# Patient Record
Sex: Male | Born: 1980 | Race: Black or African American | Hispanic: No | Marital: Married | Smoking: Never smoker
Health system: Southern US, Community
[De-identification: ages and names within clinical notes are randomized; demographics above are authoritative.]

## PROBLEM LIST (undated history)

## (undated) DIAGNOSIS — E119 Type 2 diabetes mellitus without complications: Secondary | ICD-10-CM

## (undated) DIAGNOSIS — E1165 Type 2 diabetes mellitus with hyperglycemia: Secondary | ICD-10-CM

## (undated) DIAGNOSIS — Z794 Long term (current) use of insulin: Principal | ICD-10-CM

## (undated) DIAGNOSIS — B181 Chronic viral hepatitis B without delta-agent: Principal | ICD-10-CM

## (undated) HISTORY — DX: Type 2 diabetes mellitus without complications: E11.9

---

## 2016-06-17 DIAGNOSIS — L308 Other specified dermatitis: Secondary | ICD-10-CM | POA: Diagnosis not present

## 2016-06-17 DIAGNOSIS — B360 Pityriasis versicolor: Secondary | ICD-10-CM | POA: Diagnosis not present

## 2016-06-17 DIAGNOSIS — D485 Neoplasm of uncertain behavior of skin: Secondary | ICD-10-CM | POA: Diagnosis not present

## 2016-07-29 DIAGNOSIS — E785 Hyperlipidemia, unspecified: Secondary | ICD-10-CM | POA: Diagnosis not present

## 2016-07-29 DIAGNOSIS — E119 Type 2 diabetes mellitus without complications: Secondary | ICD-10-CM | POA: Diagnosis not present

## 2016-07-29 DIAGNOSIS — I10 Essential (primary) hypertension: Secondary | ICD-10-CM | POA: Diagnosis not present

## 2016-08-01 DIAGNOSIS — H52223 Regular astigmatism, bilateral: Secondary | ICD-10-CM | POA: Diagnosis not present

## 2016-10-07 DIAGNOSIS — Z23 Encounter for immunization: Secondary | ICD-10-CM | POA: Diagnosis not present

## 2016-11-10 DIAGNOSIS — E785 Hyperlipidemia, unspecified: Secondary | ICD-10-CM | POA: Diagnosis not present

## 2016-11-10 DIAGNOSIS — E119 Type 2 diabetes mellitus without complications: Secondary | ICD-10-CM | POA: Diagnosis not present

## 2017-01-22 DIAGNOSIS — G510 Bell's palsy: Secondary | ICD-10-CM | POA: Diagnosis not present

## 2017-01-22 DIAGNOSIS — E119 Type 2 diabetes mellitus without complications: Secondary | ICD-10-CM | POA: Diagnosis not present

## 2017-02-25 DIAGNOSIS — E119 Type 2 diabetes mellitus without complications: Secondary | ICD-10-CM | POA: Diagnosis not present

## 2017-02-25 DIAGNOSIS — E785 Hyperlipidemia, unspecified: Secondary | ICD-10-CM | POA: Diagnosis not present

## 2017-09-29 DIAGNOSIS — E119 Type 2 diabetes mellitus without complications: Secondary | ICD-10-CM | POA: Diagnosis not present

## 2017-10-26 DIAGNOSIS — E119 Type 2 diabetes mellitus without complications: Secondary | ICD-10-CM | POA: Diagnosis not present

## 2017-10-26 DIAGNOSIS — J069 Acute upper respiratory infection, unspecified: Secondary | ICD-10-CM | POA: Diagnosis not present

## 2017-12-22 DIAGNOSIS — E785 Hyperlipidemia, unspecified: Secondary | ICD-10-CM | POA: Diagnosis not present

## 2017-12-22 DIAGNOSIS — E1165 Type 2 diabetes mellitus with hyperglycemia: Secondary | ICD-10-CM | POA: Diagnosis not present

## 2017-12-22 DIAGNOSIS — R030 Elevated blood-pressure reading, without diagnosis of hypertension: Secondary | ICD-10-CM | POA: Diagnosis not present

## 2017-12-22 DIAGNOSIS — B360 Pityriasis versicolor: Secondary | ICD-10-CM | POA: Diagnosis not present

## 2018-02-16 DIAGNOSIS — E1165 Type 2 diabetes mellitus with hyperglycemia: Secondary | ICD-10-CM | POA: Diagnosis not present

## 2018-03-12 DIAGNOSIS — R030 Elevated blood-pressure reading, without diagnosis of hypertension: Secondary | ICD-10-CM | POA: Diagnosis not present

## 2018-03-12 DIAGNOSIS — N342 Other urethritis: Secondary | ICD-10-CM | POA: Diagnosis not present

## 2018-04-14 ENCOUNTER — Encounter: Payer: Self-pay | Admitting: Endocrinology

## 2018-04-14 ENCOUNTER — Ambulatory Visit: Payer: BLUE CROSS/BLUE SHIELD | Admitting: Endocrinology

## 2018-04-14 DIAGNOSIS — E119 Type 2 diabetes mellitus without complications: Secondary | ICD-10-CM | POA: Diagnosis not present

## 2018-04-14 LAB — POCT GLYCOSYLATED HEMOGLOBIN (HGB A1C): Hemoglobin A1C: 11.5 % — AB (ref 4.0–5.6)

## 2018-04-14 MED ORDER — LINAGLIPTIN-METFORMIN HCL ER 2.5-1000 MG PO TB24: 2.0000 | ORAL_TABLET | ORAL | 11 refills | Status: DC

## 2018-04-14 NOTE — Patient Instructions (Signed)
good diet and exercise significantly improve the control of your diabetes.  please let me know if you wish to be referred to a dietician.  high blood sugar is very risky to your health.  you should see an eye doctor and dentist every year.  It is very important to get all recommended vaccinations.  Controlling your blood pressure and cholesterol drastically reduces the damage diabetes does to your body.  Those who smoke should quit.  Please discuss these with your doctor.  check your blood sugar once a day.  vary the time of day when you check, between before the 3 meals, and at bedtime.  also check if you have symptoms of your blood sugar being too high or too low.  please keep a record of the readings and bring it to your next appointment here (or you can bring the meter itself).  You can write it on any piece of paper.  please call us sooner if your blood sugar goes below 70, or if you have a lot of readings over 200.   Please continue the same jardiance. I have sent a prescription to your pharmacy for "jentadueto." Please call or message us next week, to tell us how the blood sugar is doing.  We can add other medications, if necessary.  Out goal is to get the blood sugar to the low-100's.  Please come back for a follow-up appointment in 2 months.

## 2018-04-14 NOTE — Progress Notes (Signed)
Subjective:    Patient ID: Duane Gonzales, male    DOB: 22-Jan-1981, 37 y.o.   MRN: [id number]  HPI pt is referred by Dr Reola Calkins, for diabetes.  Pt states DM was dx'ed in 2018; he has mild if any neuropathy of the lower extremities; he is unaware of any associated chronic complications; he has never been on insulin; pt says his diet and exercise are fair; he has never had pancreatitis, pancreatic surgery, severe hypoglycemia or DKA.  He says metformin causes diarrhea.  He says cbg's are in the 200's.   Past Medical History:  Diagnosis Date  . Diabetes [facility])      Social History   Socioeconomic History  . Marital status: Married    Spouse name: Not on file  . Number of children: Not on file  . Years of education: Not on file  . Highest education level: Not on file  Occupational History  . Not on file  Social Needs  . Financial resource strain: Not on file  . Food insecurity:    Worry: Not on file    Inability: Not on file  . Transportation needs:    Medical: Not on file    Non-medical: Not on file  Tobacco Use  . Smoking status: Never Smoker  . Smokeless tobacco: Never Used  Substance and Sexual Activity  . Alcohol use: Never    Frequency: Never  . Drug use: Never  . Sexual activity: Yes    Partners: Female  Lifestyle  . Physical activity:    Days per week: Not on file    Minutes per session: Not on file  . Stress: Not on file  Relationships  . Social connections:    Talks on phone: Not on file    Gets together: Not on file    Attends religious service: Not on file    Active member of club or organization: Not on file    Attends meetings of clubs or organizations: Not on file    Relationship status: Not on file  . Intimate partner violence:    Fear of current or ex partner: Not on file    Emotionally abused: Not on file    Physically abused: Not on file    Forced sexual activity: Not on file  Other Topics Concern  . Not on file  Social History Narrative  .  Not on file    Current Outpatient Medications on File Prior to Visit  Medication Sig Dispense Refill  . atorvastatin (LIPITOR) 20 MG tablet     . empagliflozin (JARDIANCE) 10 MG TABS tablet     . metFORMIN (GLUCOPHAGE) 850 MG tablet      No current facility-administered medications on file prior to visit.     Allergies  Allergen Reactions  . Metformin And Related Diarrhea    Family History  Problem Relation Age of Onset  . Diabetes Father     BP 124/86 (BP Location: Left Arm, Patient Position: Sitting, Cuff Size: Normal)   Pulse 90   Ht 5' 9.5" (1.765 m)   Wt 196 lb 6.4 oz (89.1 kg)   SpO2 97%   BMI 28.59 kg/m    Review of Systems denies blurry vision, headache, chest pain, sob, n/v, urinary frequency, muscle cramps, memory loss, depression, cold intolerance, rhinorrhea, and easy bruising. He has lost 30 lbs x 6 mos.  He has chronically excessive diaphoresis.     Objective:   Physical Exam VS: see vs page GEN: no distress  HEAD: head: no deformity eyes: no periorbital swelling, no proptosis external nose and ears are normal mouth: no lesion seen NECK: supple, thyroid is not enlarged CHEST WALL: no deformity LUNGS: clear to auscultation CV: reg rate and rhythm, no murmur ABD: abdomen is soft, nontender.  no hepatosplenomegaly.  not distended.  no hernia MUSCULOSKELETAL: muscle bulk and strength are grossly normal.  no obvious joint swelling.  gait is normal and steady EXTEMITIES: no deformity.  no ulcer on the feet.  feet are of normal color and temp.  no edema PULSES: dorsalis pedis intact bilat.  no carotid bruit NEURO:  cn 2-12 grossly intact.   readily moves all 4's.  sensation is intact to touch on the feet SKIN:  Normal texture and temperature.  No rash or suspicious lesion is visible.   NODES:  None palpable at the neck PSYCH: alert, well-oriented.  Does not appear anxious nor depressed.    Lab Results  Component Value Date   HGBA1C 11.5 (A) 04/14/2018     I have reviewed outside records, and summarized: Pt was noted to have elevated a1c, and referred here.  He was about to leave for [place].  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  I advised insulin, but he declines.  Diarrhea: she needs to change metformin to -XR  Patient Instructions  good diet and exercise significantly improve the control of your diabetes.  please let me know if you wish to be referred to a dietician.  high blood sugar is very risky to your health.  you should see an eye doctor and dentist every year.  It is very important to get all recommended vaccinations.  Controlling your blood pressure and cholesterol drastically reduces the damage diabetes does to your body.  Those who smoke should quit.  Please discuss these with your doctor.  check your blood sugar once a day.  vary the time of day when you check, between before the 3 meals, and at bedtime.  also check if you have symptoms of your blood sugar being too high or too low.  please keep a record of the readings and bring it to your next appointment here (or you can bring the meter itself).  You can write it on any piece of paper.  please call us sooner if your blood sugar goes below 70, or if you have a lot of readings over 200.   Please continue the same jardiance. I have sent a prescription to your pharmacy for "jentadueto." Please call or message us next week, to tell us how the blood sugar is doing.  We can add other medications, if necessary.  Out goal is to get the blood sugar to the low-100's.  Please come back for a follow-up appointment in 2 months.

## 2018-04-16 DIAGNOSIS — H52223 Regular astigmatism, bilateral: Secondary | ICD-10-CM | POA: Diagnosis not present

## 2018-04-17 ENCOUNTER — Encounter: Payer: Self-pay | Admitting: Endocrinology

## 2018-04-29 ENCOUNTER — Telehealth: Payer: Self-pay | Admitting: Endocrinology

## 2018-04-29 DIAGNOSIS — S46812A Strain of other muscles, fascia and tendons at shoulder and upper arm level, left arm, initial encounter: Secondary | ICD-10-CM | POA: Diagnosis not present

## 2018-04-29 DIAGNOSIS — G5602 Carpal tunnel syndrome, left upper limb: Secondary | ICD-10-CM | POA: Diagnosis not present

## 2018-04-29 NOTE — Telephone Encounter (Signed)
[facility] [place] - [address]   Patient stated that the medication needs to be approved per the pharmacy         linaGLIPtin-metFORMIN HCl ER (JENTADUETO XR) 2.12-998 MG TB24

## 2018-04-30 ENCOUNTER — Telehealth: Payer: Self-pay | Admitting: Endocrinology

## 2018-04-30 NOTE — Telephone Encounter (Signed)
[facility] is calling to let us know they have faxed over a 2nd request for a PA ,     linaGLIPtin-metFORMIN HCl ER (JENTADUETO XR) 2.12-998 MG TB24

## 2018-05-03 NOTE — Telephone Encounter (Signed)
PA has been iniated awaiting a response

## 2018-05-03 NOTE — Telephone Encounter (Signed)
PA has been started awaiting response

## 2018-05-10 NOTE — Telephone Encounter (Signed)
Pt called stating that the PA was denied pt staes the insurance company called him. Pt was told another 2nd line Rx needs to be filled and if that fails pt can try another line of medication. Please advise thank you!  Pharmacy is [facility] - [place], [place] - [address]  Call pt @ [phone].

## 2018-05-11 MED ORDER — PIOGLITAZONE HCL 30 MG PO TABS: 30.0000 mg | ORAL_TABLET | Freq: Every day | ORAL | 3 refills | Status: DC

## 2018-05-11 NOTE — Addendum Note (Signed)
Addended by: Romero BellingELLISON, Williamson Cavanah on: 05/11/2018 04:35 PM   Modules accepted: Orders

## 2018-05-11 NOTE — Telephone Encounter (Signed)
Please advise on what else pt can be placed on

## 2018-05-11 NOTE — Telephone Encounter (Signed)
Ok, I have sent a prescription to your pharmacy, to add "pioglitazone." I'll see you next time.

## 2018-05-12 ENCOUNTER — Telehealth: Payer: Self-pay | Admitting: Endocrinology

## 2018-05-12 NOTE — Telephone Encounter (Signed)
I need to know which med is being declined?

## 2018-05-12 NOTE — Telephone Encounter (Signed)
Informed pt that this was sent to his pharmacy

## 2018-05-12 NOTE — Telephone Encounter (Signed)
PER THMCC-"Caller states a medication that was written for him needs a doctor to approve the second line treatment for the insurance. Will need the rx changed. Is on Metformin but having side effect"

## 2018-05-12 NOTE — Telephone Encounter (Signed)
Please advise 

## 2018-05-13 MED ORDER — LINAGLIPTIN 5 MG PO TABS: 5.0000 mg | ORAL_TABLET | Freq: Every day | ORAL | 11 refills | Status: DC

## 2018-05-13 NOTE — Telephone Encounter (Signed)
Pt would like for Dr. Everardo All to call him personally because he stated that what he is taking is in his chart and I should not be calling making sure of what he is taking because we have a list of what he is taking. Please advise

## 2018-05-13 NOTE — Telephone Encounter (Signed)
I called pt He takes jardiance and pioglitazone He declines to resume metformin (diarrrhea). cbg's are in the 200's. I have sent a prescription to your pharmacy, to add Tradjenta.  I assume this will need PA, so please complete with this info. Please call or message [place] next week, to tell us how the blood sugar is doing

## 2018-05-13 NOTE — Telephone Encounter (Signed)
lft pt vm to return call 

## 2018-05-13 NOTE — Telephone Encounter (Signed)
Spoke to pt, pt was confused about why he was to start Actos, he wanted to know is he supposed to stop taking the metformin?

## 2018-05-13 NOTE — Telephone Encounter (Signed)
Thi is what I want pt to take: Metformin Pioglitazone jardiance (or another in its category) tradjenta (or another in its category) Please let me know what meds pt takes as of now, and how cbg's are doing

## 2018-06-14 ENCOUNTER — Ambulatory Visit (INDEPENDENT_AMBULATORY_CARE_PROVIDER_SITE_OTHER): Payer: BLUE CROSS/BLUE SHIELD | Admitting: Endocrinology

## 2018-06-14 ENCOUNTER — Encounter: Payer: Self-pay | Admitting: Endocrinology

## 2018-06-14 VITALS — BP 112/70 | HR 79 | Ht 69.5 in | Wt 192.8 lb

## 2018-06-14 DIAGNOSIS — E119 Type 2 diabetes mellitus without complications: Secondary | ICD-10-CM | POA: Diagnosis not present

## 2018-06-14 MED ORDER — GLIMEPIRIDE 2 MG PO TABS: 2.0000 mg | ORAL_TABLET | Freq: Every day | ORAL | 3 refills | Status: DC

## 2018-06-14 MED ORDER — EMPAGLIFLOZIN 10 MG PO TABS: 10.0000 mg | ORAL_TABLET | Freq: Every day | ORAL | 11 refills | Status: DC

## 2018-06-14 NOTE — Progress Notes (Signed)
   Subjective:    Patient ID: Duane Gonzales, male    DOB: 20-Jun-1981, 37 y.o.   MRN: [id number]  HPI    Review of Systems     Objective:   Physical Exam        Assessment & Plan:

## 2018-06-14 NOTE — Progress Notes (Signed)
Subjective:    Patient ID: Duane Gonzales, male    DOB: [birth year], 37 y.o.   MRN: [id number]  HPI Pt returns for f/u of diabetes mellitus: DM type: 2 Dx'ed: 2018 Complications: none Therapy: insulin since GDM: never DKA: never Severe hypoglycemia: never Pancreatitis: never Pancreatic imaging: never Other: he declines insulin; he says metformin causes diarrhea;  Ins declined tradjenta.  Interval history: Pt says fianancial probs are limiting his health care.  He takes jardiance and pioglitazone only.  no cbg record, but states cbg's are approx 200.  pt states he feels well in general.   Past Medical History:  Diagnosis Date  . Diabetes [facility])      Social History   Socioeconomic History  . Marital status: Married    Spouse name: Not on file  . Number of children: Not on file  . Years of education: Not on file  . Highest education level: Not on file  Occupational History  . Not on file  Social Needs  . Financial resource strain: Not on file  . Food insecurity:    Worry: Not on file    Inability: Not on file  . Transportation needs:    Medical: Not on file    Non-medical: Not on file  Tobacco Use  . Smoking status: Never Smoker  . Smokeless tobacco: Never Used  Substance and Sexual Activity  . Alcohol use: Never    Frequency: Never  . Drug use: Never  . Sexual activity: Yes    Partners: Female  Lifestyle  . Physical activity:    Days per week: Not on file    Minutes per session: Not on file  . Stress: Not on file  Relationships  . Social connections:    Talks on phone: Not on file    Gets together: Not on file    Attends religious service: Not on file    Active member of club or organization: Not on file    Attends meetings of clubs or organizations: Not on file    Relationship status: Not on file  . Intimate partner violence:    Fear of current or ex partner: Not on file    Emotionally abused: Not on file    Physically abused: Not on file    Forced  sexual activity: Not on file  Other Topics Concern  . Not on file  Social History Narrative  . Not on file    Current Outpatient Medications on File Prior to Visit  Medication Sig Dispense Refill  . atorvastatin (LIPITOR) 20 MG tablet     . pioglitazone (ACTOS) 30 MG tablet Take 1 tablet (30 mg total) by mouth daily. 90 tablet 3   No current facility-administered medications on file prior to visit.     Allergies  Allergen Reactions  . Metformin And Related Diarrhea    Family History  Problem Relation Age of Onset  . Diabetes Father     BP 112/70 (BP Location: Left Arm, Patient Position: Sitting, Cuff Size: Normal)   Pulse 79   Ht 5' 9.5" (1.765 m)   Wt 192 lb 12.8 oz (87.5 kg)   SpO2 97%   BMI 28.06 kg/m   Review of Systems He denies hypoglycemia.     Objective:   Physical Exam VITAL SIGNS:  See vs page GENERAL: no distress Pulses: dorsalis pedis intact bilat.   MSK: no deformity of the feet CV: no leg edema Skin:  no ulcer on the feet.  normal color  and temp on the feet. Neuro: sensation is intact to touch on the feet  Pt declines a1c    Assessment & Plan:  Type 2 DM: he needs increased rx   Patient Instructions  Please continue the same pioglitazone and jardiance. I have sent a prescription to your pharmacy, to add "glimepiride."  check your blood sugar once a day.  vary the time of day when you check, between before the 3 meals, and at bedtime.  also check if you have symptoms of your blood sugar being too high or too low.  please keep a record of the readings and bring it to your next appointment here (or you can bring the meter itself).  You can write it on any piece of paper.  please call us sooner if your blood sugar goes below 70, or if you have a lot of readings over 200. Please come back for a follow-up appointment in 3 months.

## 2018-06-14 NOTE — Patient Instructions (Addendum)
Please continue the same pioglitazone and jardiance. I have sent a prescription to your pharmacy, to add "glimepiride."  check your blood sugar once a day.  vary the time of day when you check, between before the 3 meals, and at bedtime.  also check if you have symptoms of your blood sugar being too high or too low.  please keep a record of the readings and bring it to your next appointment here (or you can bring the meter itself).  You can write it on any piece of paper.  please call us sooner if your blood sugar goes below 70, or if you have a lot of readings over 200. Please come back for a follow-up appointment in 3 months.

## 2018-08-10 DIAGNOSIS — S61201A Unspecified open wound of left index finger without damage to nail, initial encounter: Secondary | ICD-10-CM | POA: Diagnosis not present

## 2018-08-18 DIAGNOSIS — S61201D Unspecified open wound of left index finger without damage to nail, subsequent encounter: Secondary | ICD-10-CM | POA: Diagnosis not present

## 2018-08-25 DIAGNOSIS — E785 Hyperlipidemia, unspecified: Secondary | ICD-10-CM | POA: Diagnosis not present

## 2018-08-25 DIAGNOSIS — E1165 Type 2 diabetes mellitus with hyperglycemia: Secondary | ICD-10-CM | POA: Diagnosis not present

## 2018-09-14 ENCOUNTER — Ambulatory Visit: Payer: BLUE CROSS/BLUE SHIELD | Admitting: Endocrinology

## 2019-01-28 DIAGNOSIS — E785 Hyperlipidemia, unspecified: Secondary | ICD-10-CM | POA: Diagnosis not present

## 2019-01-28 DIAGNOSIS — E119 Type 2 diabetes mellitus without complications: Secondary | ICD-10-CM | POA: Diagnosis not present

## 2019-01-31 DIAGNOSIS — R740 Nonspecific elevation of levels of transaminase and lactic acid dehydrogenase [LDH]: Secondary | ICD-10-CM | POA: Diagnosis not present

## 2019-02-02 DIAGNOSIS — R740 Nonspecific elevation of levels of transaminase and lactic acid dehydrogenase [LDH]: Secondary | ICD-10-CM | POA: Diagnosis not present

## 2019-02-02 DIAGNOSIS — K759 Inflammatory liver disease, unspecified: Secondary | ICD-10-CM | POA: Diagnosis not present

## 2019-02-02 DIAGNOSIS — E119 Type 2 diabetes mellitus without complications: Secondary | ICD-10-CM | POA: Diagnosis not present

## 2019-02-02 DIAGNOSIS — B181 Chronic viral hepatitis B without delta-agent: Secondary | ICD-10-CM | POA: Diagnosis not present

## 2019-02-03 ENCOUNTER — Other Ambulatory Visit: Payer: Self-pay | Admitting: Gastroenterology

## 2019-02-07 ENCOUNTER — Other Ambulatory Visit: Payer: Self-pay | Admitting: Gastroenterology

## 2019-02-07 DIAGNOSIS — B1910 Unspecified viral hepatitis B without hepatic coma: Secondary | ICD-10-CM

## 2019-02-15 ENCOUNTER — Ambulatory Visit
Admission: RE | Admit: 2019-02-15 | Discharge: 2019-02-15 | Disposition: A | Discharge status: Home / Self Care | Payer: BLUE CROSS/BLUE SHIELD | Source: Ambulatory Visit | Attending: Gastroenterology | Admitting: Gastroenterology

## 2019-02-15 DIAGNOSIS — B1910 Unspecified viral hepatitis B without hepatic coma: Secondary | ICD-10-CM

## 2019-02-15 DIAGNOSIS — K760 Fatty (change of) liver, not elsewhere classified: Secondary | ICD-10-CM | POA: Diagnosis not present

## 2019-05-24 DIAGNOSIS — E1165 Type 2 diabetes mellitus with hyperglycemia: Secondary | ICD-10-CM | POA: Diagnosis not present

## 2019-05-24 DIAGNOSIS — E785 Hyperlipidemia, unspecified: Secondary | ICD-10-CM | POA: Diagnosis not present

## 2019-05-24 DIAGNOSIS — B181 Chronic viral hepatitis B without delta-agent: Secondary | ICD-10-CM | POA: Diagnosis not present

## 2019-05-24 DIAGNOSIS — R079 Chest pain, unspecified: Secondary | ICD-10-CM | POA: Diagnosis not present

## 2019-06-09 DIAGNOSIS — Z113 Encounter for screening for infections with a predominantly sexual mode of transmission: Secondary | ICD-10-CM | POA: Diagnosis not present

## 2019-08-03 DIAGNOSIS — E119 Type 2 diabetes mellitus without complications: Secondary | ICD-10-CM | POA: Diagnosis not present

## 2019-08-03 DIAGNOSIS — B181 Chronic viral hepatitis B without delta-agent: Secondary | ICD-10-CM | POA: Diagnosis not present

## 2019-08-24 DIAGNOSIS — J988 Other specified respiratory disorders: Secondary | ICD-10-CM | POA: Diagnosis not present

## 2019-09-26 DIAGNOSIS — E785 Hyperlipidemia, unspecified: Secondary | ICD-10-CM | POA: Diagnosis not present

## 2019-09-26 DIAGNOSIS — E1165 Type 2 diabetes mellitus with hyperglycemia: Secondary | ICD-10-CM | POA: Diagnosis not present

## 2019-11-15 DIAGNOSIS — Z03818 Encounter for observation for suspected exposure to other biological agents ruled out: Secondary | ICD-10-CM | POA: Diagnosis not present

## 2020-01-04 DIAGNOSIS — E119 Type 2 diabetes mellitus without complications: Secondary | ICD-10-CM | POA: Diagnosis not present

## 2020-01-30 DIAGNOSIS — B181 Chronic viral hepatitis B without delta-agent: Secondary | ICD-10-CM | POA: Diagnosis not present

## 2020-01-30 DIAGNOSIS — E119 Type 2 diabetes mellitus without complications: Secondary | ICD-10-CM | POA: Diagnosis not present

## 2020-01-30 DIAGNOSIS — E785 Hyperlipidemia, unspecified: Secondary | ICD-10-CM | POA: Diagnosis not present

## 2020-02-01 DIAGNOSIS — E119 Type 2 diabetes mellitus without complications: Secondary | ICD-10-CM | POA: Diagnosis not present

## 2020-02-01 DIAGNOSIS — B181 Chronic viral hepatitis B without delta-agent: Secondary | ICD-10-CM | POA: Diagnosis not present

## 2020-07-11 IMAGING — US ULTRASOUND ABDOMEN LIMITED
1 series · 14 of 25 positions shown · non-contrast
Comparison: None.

CLINICAL DATA: Hepatitis-B

EXAM:
ULTRASOUND ABDOMEN LIMITED RIGHT UPPER QUADRANT

[Series 1: ultrasound abdomen limited · 0.20mm/px · 14 of 45 slices shown]
[im 1/45]
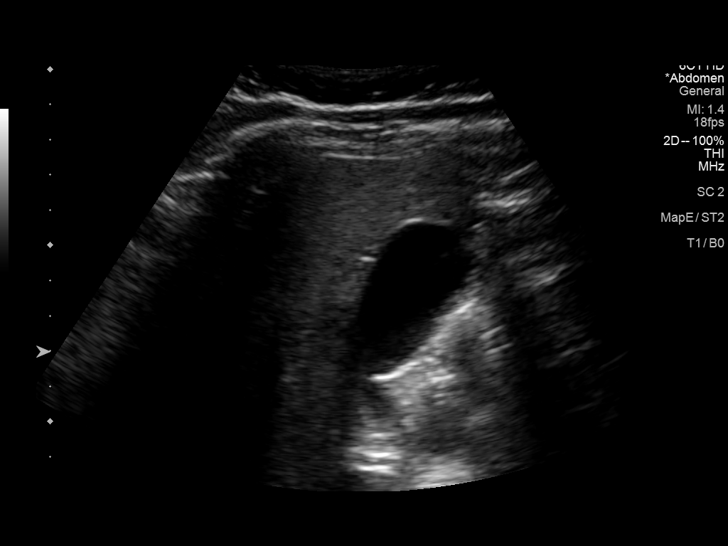
[im 4/45]
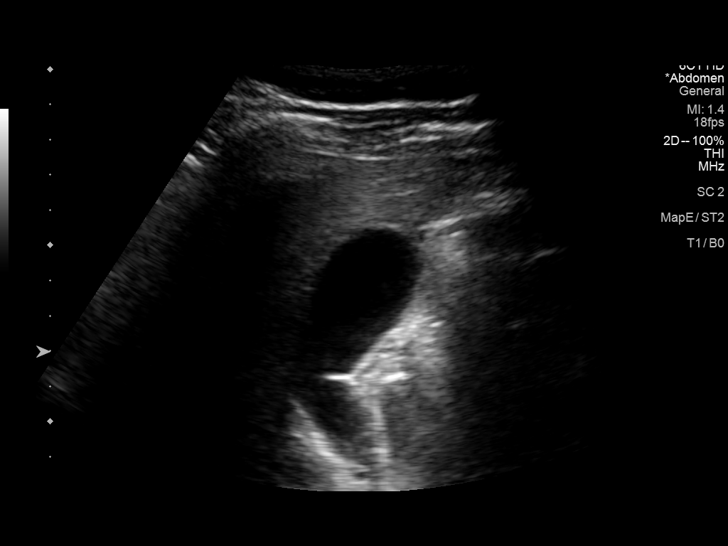
[im 8/45]
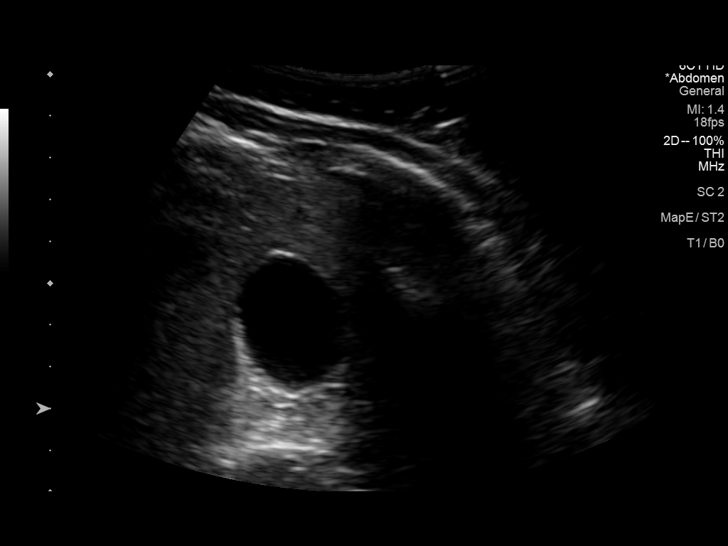
[im 12/45]
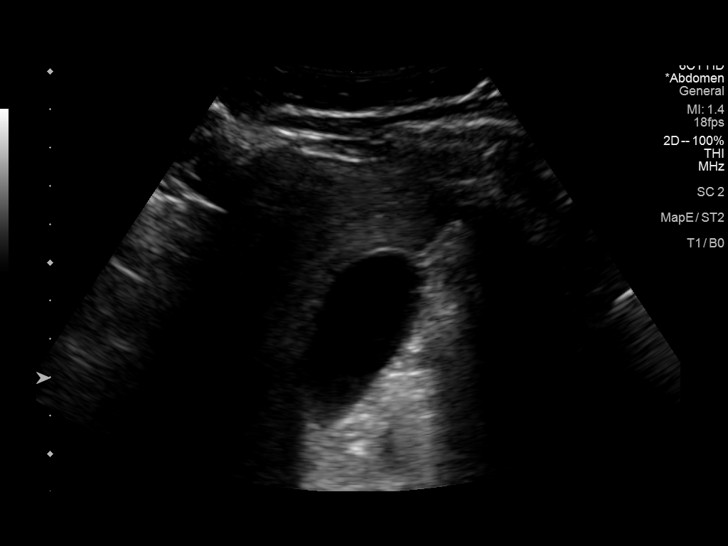
[im 15/45]
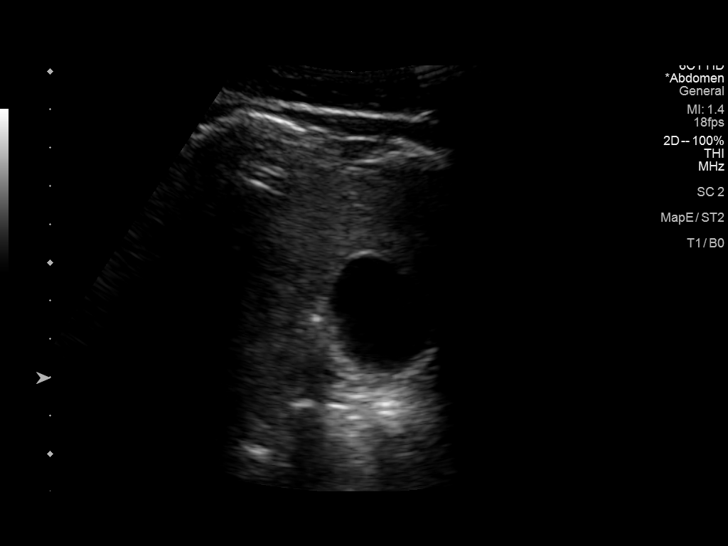
[im 17/45]
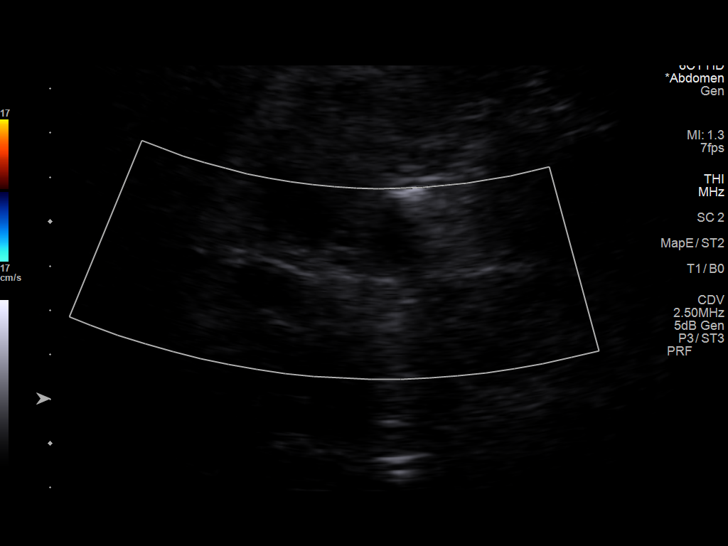
[im 21/45]
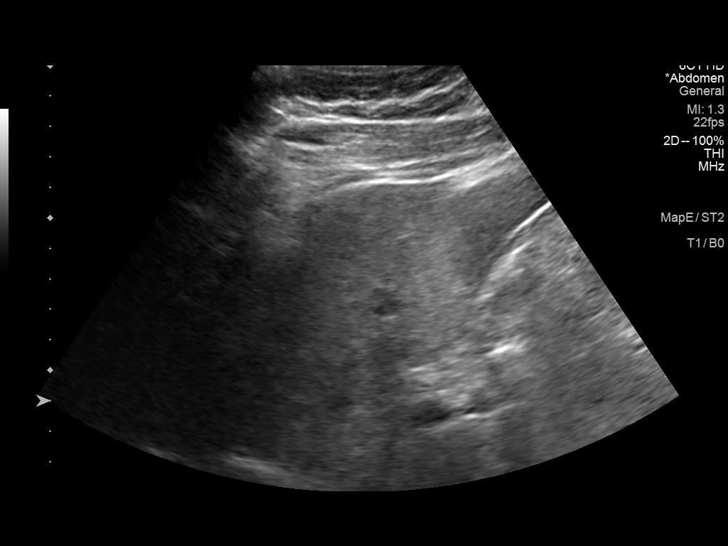
[im 24/45]
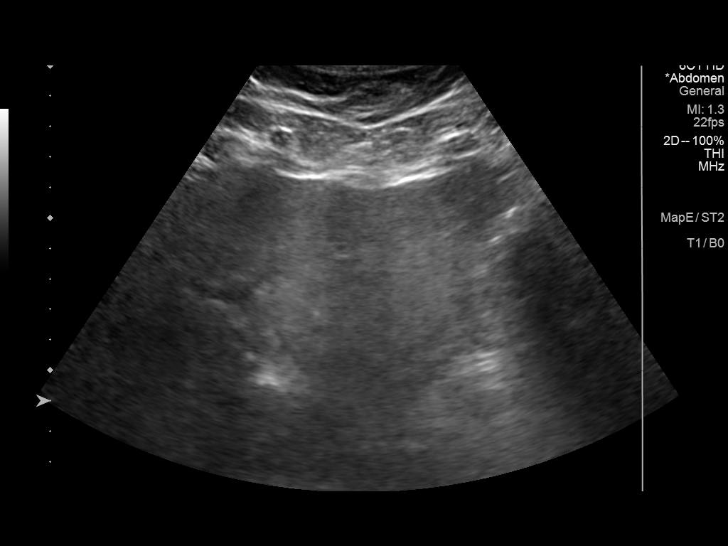
[im 28/45]
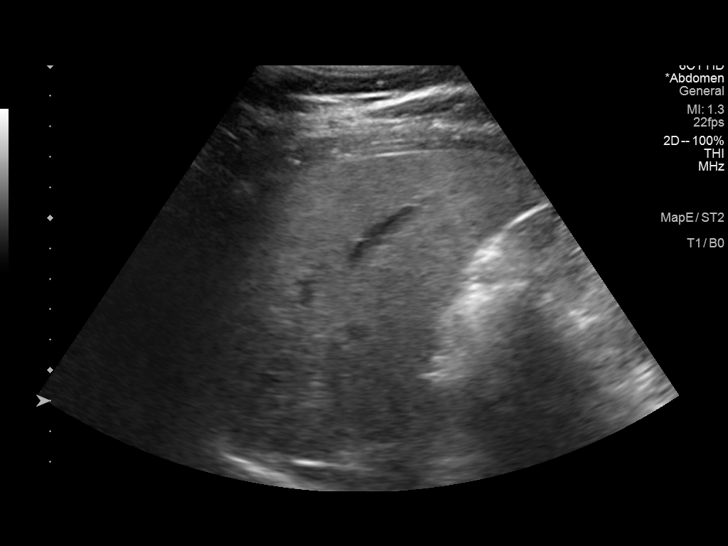
[im 30/45]
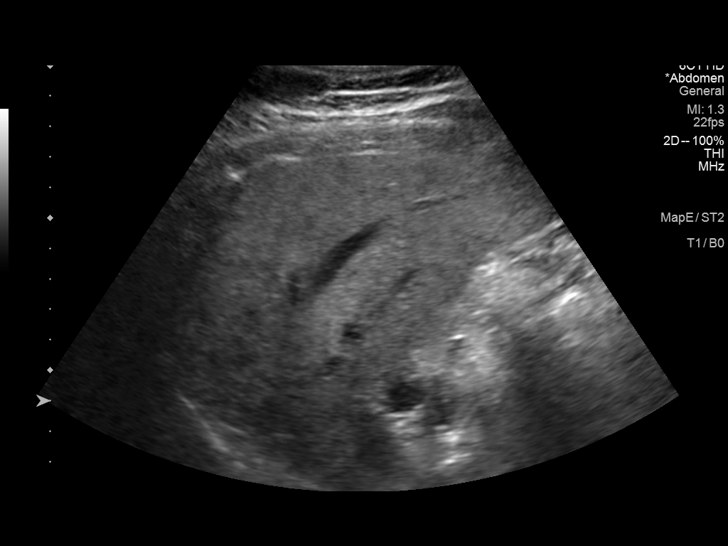
[im 34/45]
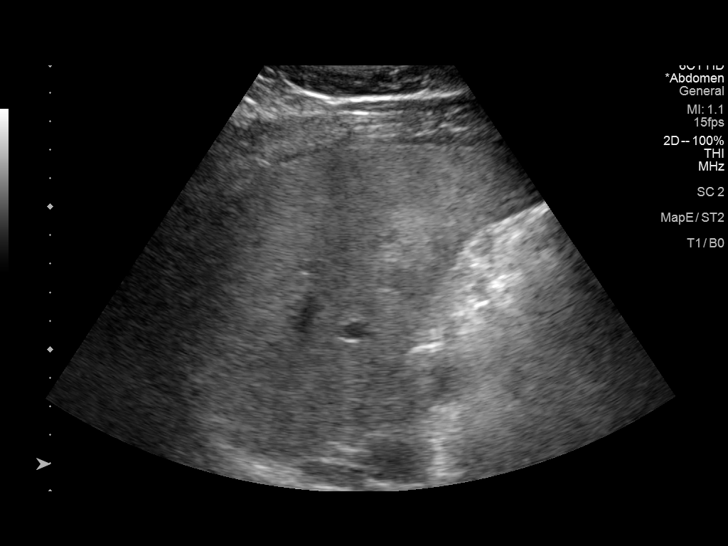
[im 37/45]
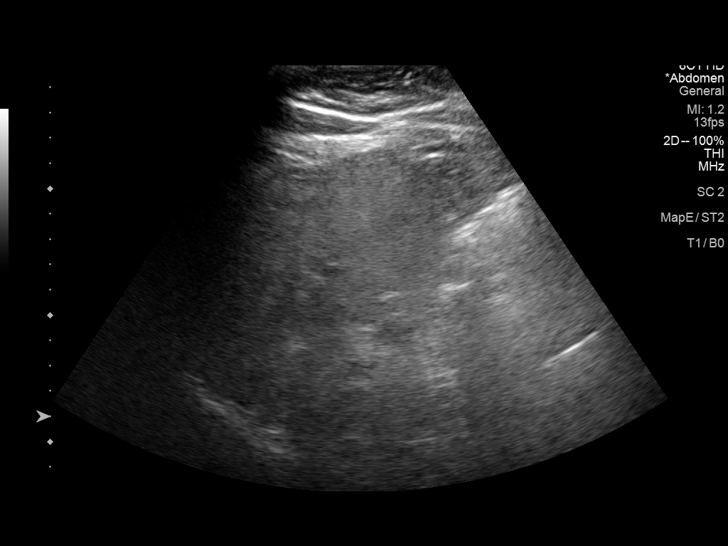
[im 41/45]
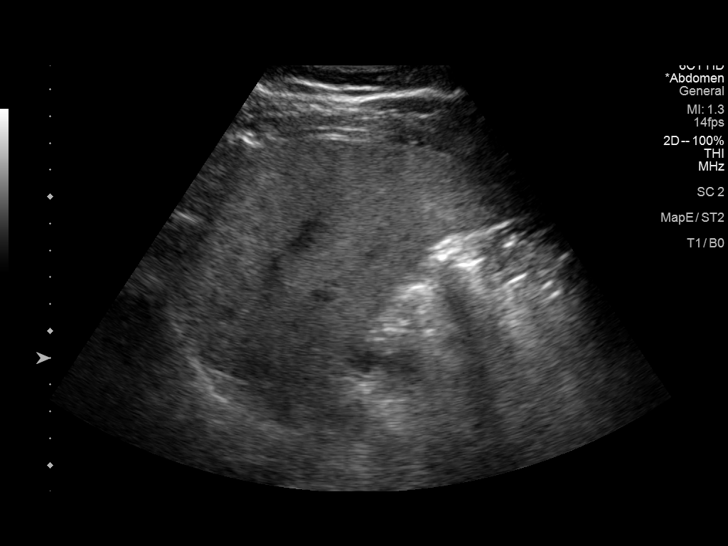
[im 45/45]
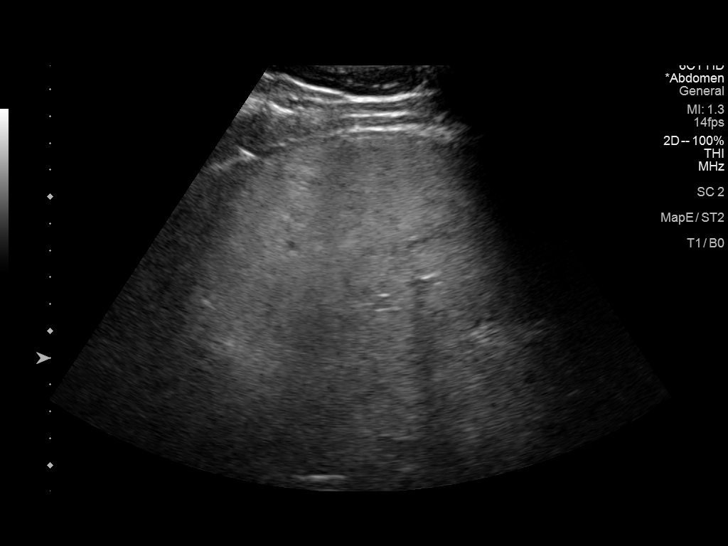

[14 of 25 positions shown; findings below may reference images not displayed]

FINDINGS: Gallbladder:

No gallstones or wall thickening visualized. No sonographic Murphy
sign noted by sonographer.

Common bile duct:

Diameter: 2.9 mm

Liver:

Diffuse increased echogenicity compatible with hepatic steatosis. No
focal hepatic abnormality or intrahepatic biliary dilatation. Portal
vein is patent on color Doppler imaging with normal direction of
blood flow towards the liver.
IMPRESSION: Hepatic steatosis.  No other acute finding by ultrasound.

## 2020-07-25 ENCOUNTER — Other Ambulatory Visit: Payer: Self-pay | Admitting: Gastroenterology

## 2020-07-25 DIAGNOSIS — B1910 Unspecified viral hepatitis B without hepatic coma: Secondary | ICD-10-CM

## 2020-08-01 ENCOUNTER — Ambulatory Visit
Admission: RE | Admit: 2020-08-01 | Discharge: 2020-08-01 | Disposition: A | Discharge status: Home / Self Care | Payer: BC Managed Care – PPO | Source: Ambulatory Visit | Attending: Gastroenterology | Admitting: Gastroenterology

## 2020-08-01 DIAGNOSIS — B1910 Unspecified viral hepatitis B without hepatic coma: Secondary | ICD-10-CM

## 2020-08-23 ENCOUNTER — Encounter: Payer: Self-pay | Admitting: Endocrinology

## 2020-08-23 ENCOUNTER — Ambulatory Visit (INDEPENDENT_AMBULATORY_CARE_PROVIDER_SITE_OTHER): Payer: Self-pay | Admitting: Endocrinology

## 2020-08-23 ENCOUNTER — Other Ambulatory Visit: Payer: Self-pay

## 2020-08-23 ENCOUNTER — Encounter: Payer: Self-pay | Attending: Dietician | Admitting: Dietician

## 2020-08-23 ENCOUNTER — Encounter: Payer: Self-pay | Admitting: Dietician

## 2020-08-23 VITALS — BP 118/64 | HR 74 | Wt 197.0 lb

## 2020-08-23 DIAGNOSIS — E119 Type 2 diabetes mellitus without complications: Secondary | ICD-10-CM | POA: Insufficient documentation

## 2020-08-23 LAB — POCT GLYCOSYLATED HEMOGLOBIN (HGB A1C): Hemoglobin A1C: 11.1 % — AB (ref 4.0–5.6)

## 2020-08-23 MED ORDER — LANTUS SOLOSTAR 100 UNIT/ML ~~LOC~~ SOPN: 20.0000 [IU] | PEN_INJECTOR | SUBCUTANEOUS | 99 refills | Status: DC

## 2020-08-23 NOTE — Patient Instructions (Addendum)
I have sent a prescription to your pharmacy, to add "Lantus," 20 units each morning.  check your blood sugar twice a day.  vary the time of day when you check, between before the 3 meals, and at bedtime.  also check if you have symptoms of your blood sugar being too high or too low.  please keep a record of the readings and bring it to your next appointment here (or you can bring the meter itself).  You can write it on any piece of paper.  please call us sooner if your blood sugar goes below 70, or if you have a lot of readings over 200. Please come back for a follow-up appointment in 2 months.

## 2020-08-23 NOTE — Progress Notes (Signed)
Patient is seen today as a walk in.  Patient of Dr. Everardo All.  He needed insulin instruction for long acting insulin using an insulin Pen.  He verbalized comfort with injections as he has been on a non insulin injectable.  He can no longer afford multiple medications to treat his diabetes. He is a PhD Production manager.  Insulin instruction provided.  Topics covered included:  Explanation of insulin type and function  Use of pen  Site preparation, injection, and site rotation  Signs and symptoms of hypoglycemia as well as treatment.  Patient was able to demonstrate. He is to call for further questions.  Oran Rein, RD, LDN, CDCES

## 2020-08-23 NOTE — Progress Notes (Signed)
Subjective:    Patient ID: Duane Gonzales, male    DOB: 01/23/1981, 40 y.o.   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   Other: he declines insulin; he says metformin causes diarrhea;  Ins declined tradjenta.  Interval history: He takes Jardiance and pioglitazone only.  He does not check cbg's.  pt states he feels well in general.  He says he cannot pay multiple name brand copays.   Past Medical History:  Diagnosis Date  . Diabetes (HCC)     No past surgical history on file.  Social History   Socioeconomic History  . Marital status: Married    Spouse name: Not on file  . Number of children: Not on file  . Years of education: Not on file  . Highest education level: Not on file  Occupational History  . Not on file  Tobacco Use  . Smoking status: Never Smoker  . Smokeless tobacco: Never Used  Substance and Sexual Activity  . Alcohol use: Never  . Drug use: Never  . Sexual activity: Yes    Partners: Female  Other Topics Concern  . Not on file  Social History Narrative  . Not on file   Social Determinants of Health   Financial Resource Strain: Not on file  Food Insecurity: Not on file  Transportation Needs: Not on file  Physical Activity: Not on file  Stress: Not on file  Social Connections: Not on file  Intimate Partner Violence: Not on file    Current Outpatient Medications on File Prior to Visit  Medication Sig Dispense Refill  . atorvastatin (LIPITOR) 20 MG tablet     . Tenofovir Alafenamide Fumarate (VEMLIDY) 25 MG TABS 1 tab(s)     No current facility-administered medications on file prior to visit.    Allergies  Allergen Reactions  . Metformin And Related Diarrhea     Family History  Problem Relation Age of Onset  . Diabetes Father     BP 118/64   Pulse 74   Wt 197 lb (89.4 kg)   SpO2 97%   BMI 28.67 kg/m    Review of Systems Denies n/v/sob    Objective:   Physical Exam VITAL SIGNS:  See vs page GENERAL: no distress Pulses: dorsalis pedis intact bilat.   MSK: no deformity of the feet CV: no leg edema Skin:  no ulcer on the feet.  normal color and temp on the feet. Neuro: sensation is intact to touch on the feet   Lab Results  Component Value Date   HGBA1C 11.1 (A) 08/23/2020       Assessment & Plan:  Type 2 DM: poor glycemia control.  I told pt he needs insulin.  He agrees.   Noncompliance with cbg recording: he is not a candidate for multiple daily injections.   Patient Instructions  I have sent a prescription to your pharmacy, to add "Lantus," 20 units each morning.  check your blood sugar twice a day.  vary the time of day when you check, between before the 3 meals, and at bedtime.  also check if you have symptoms of your blood sugar being too high or too low.  please keep a record of the readings and bring it to your  next appointment here (or you can bring the meter itself).  You can write it on any piece of paper.  please call us sooner if your blood sugar goes below 70, or if you have a lot of readings over 200. Please come back for a follow-up appointment in 2 months.

## 2020-10-23 ENCOUNTER — Other Ambulatory Visit: Payer: Self-pay

## 2020-10-25 ENCOUNTER — Other Ambulatory Visit: Payer: Self-pay

## 2020-10-25 ENCOUNTER — Ambulatory Visit (INDEPENDENT_AMBULATORY_CARE_PROVIDER_SITE_OTHER): Payer: Self-pay | Admitting: Endocrinology

## 2020-10-25 VITALS — BP 126/80 | HR 72 | Ht 70.0 in | Wt 196.5 lb

## 2020-10-25 DIAGNOSIS — E119 Type 2 diabetes mellitus without complications: Secondary | ICD-10-CM

## 2020-10-25 LAB — POCT GLYCOSYLATED HEMOGLOBIN (HGB A1C): Hemoglobin A1C: 12.9 % — AB (ref 4.0–5.6)

## 2020-10-25 MED ORDER — INSULIN GLARGINE-YFGN 100 UNIT/ML ~~LOC~~ SOPN: 30.0000 [IU] | PEN_INJECTOR | SUBCUTANEOUS | 3 refills | Status: DC

## 2020-10-25 NOTE — Progress Notes (Signed)
Subjective:    Patient ID: Duane Gonzales, male    DOB: 1981-05-12, 40 y.o.   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   Other: he declines insulin; he says metformin causes diarrhea;  Ins declined tradjenta.  Interval history: pt says cbg's are in the 200's.  pt states he feels well in general.  He says he never misses the insulin.   Past Medical History:  Diagnosis Date  . Diabetes (HCC)     No past surgical history on file.  Social History   Socioeconomic History  . Marital status: Married    Spouse name: Not on file  . Number of children: Not on file  . Years of education: Not on file  . Highest education level: Not on file  Occupational History  . Not on file  Tobacco Use  . Smoking status: Never Smoker  . Smokeless tobacco: Never Used  Substance and Sexual Activity  . Alcohol use: Never  . Drug use: Never  . Sexual activity: Yes    Partners: Female  Other Topics Concern  . Not on file  Social History Narrative  . Not on file   Social Determinants of Health   Financial Resource Strain: Not on file  Food Insecurity: Not on file  Transportation Needs: Not on file  Physical Activity: Not on file  Stress: Not on file  Social Connections: Not on file  Intimate Partner Violence: Not on file    Current Outpatient Medications on File Prior to Visit  Medication Sig Dispense Refill  . atorvastatin (LIPITOR) 20 MG tablet     . Tenofovir Alafenamide Fumarate (VEMLIDY) 25 MG TABS 1 tab(s)     No current facility-administered medications on file prior to visit.    Allergies  Allergen Reactions  . Metformin And Related Diarrhea    Family History   Problem Relation Age of Onset  . Diabetes Father     BP 126/80 (BP Location: Right Arm, Patient Position: Sitting, Cuff Size: Normal)   Pulse 72   Ht 5\' 10"  (1.778 m)   Wt 196 lb 8 oz (89.1 kg)   SpO2 96%   BMI 28.19 kg/m    Review of Systems No weight change    Objective:   Physical Exam VITAL SIGNS:  See vs page GENERAL: no distress Pulses: dorsalis pedis intact bilat.   MSK: no deformity of the feet CV: no leg edema Skin:  no ulcer on the feet.  normal color and temp on the feet. Neuro: sensation is intact to touch on the feet.     Lab Results  Component Value Date   HGBA1C 12.9 (A) 10/25/2020       Assessment & Plan:  Insulin-requiring type 2 DM: uncontrolled.     Patient Instructions  I have sent a prescription to your pharmacy, to change Lantus to [facility] (cheaper brand), 30 units each morning. check your blood sugar twice a day.  vary the time of day when you check, between before the 3 meals, and at bedtime.  also check if you have symptoms of your blood sugar being too high or too low.  please keep a record of the readings and bring it to your next appointment here (  or you can bring the meter itself).  You can write it on any piece of paper.  please call us sooner if your blood sugar goes below 70, or if you have a lot of readings over 200.  Please come back for a follow-up appointment in 2 months.

## 2020-10-25 NOTE — Patient Instructions (Addendum)
I have sent a prescription to your pharmacy, to change Lantus to [facility] [place] (cheaper brand), 30 units each morning. check your blood sugar twice a day.  vary the time of day when you check, between before the 3 meals, and at bedtime.  also check if you have symptoms of your blood sugar being too high or too low.  please keep a record of the readings and bring it to your next appointment here (or you can bring the meter itself).  You can write it on any piece of paper.  please call us sooner if your blood sugar goes below 70, or if you have a lot of readings over 200.  Please come back for a follow-up appointment in 2 months.

## 2020-10-26 ENCOUNTER — Telehealth: Payer: Self-pay

## 2020-10-26 MED ORDER — LANTUS SOLOSTAR 100 UNIT/ML ~~LOC~~ SOPN: 30.0000 [IU] | PEN_INJECTOR | SUBCUTANEOUS | 99 refills | Status: DC

## 2020-10-26 NOTE — Telephone Encounter (Signed)
Just got off the phone with [employer] and it was stated that the pt was denied for [facility] and needs to have tried [place], Levemir, or [place].  Please Advise

## 2020-10-26 NOTE — Telephone Encounter (Signed)
OK, I have sent a prescription to your pharmacy, to change 

## 2020-10-28 NOTE — Telephone Encounter (Signed)
Spoke with pt to let him know that another Rx has sent in to the pharmacy.

## 2020-10-29 ENCOUNTER — Telehealth: Payer: Self-pay

## 2020-10-29 NOTE — Telephone Encounter (Signed)
PA denied for Semglee(yfgn) 100u/ml

## 2020-11-05 ENCOUNTER — Telehealth: Payer: Self-pay | Admitting: Endocrinology

## 2020-11-05 NOTE — Telephone Encounter (Signed)
Patient called to advise that provider had indicated at the last that he was going to change patient insulin o one that is generic and more cost effective.  Patient stated that Dr Everardo All was supposed to call in "2 bottles of replacement insulin"  Call back # [phone]

## 2020-11-05 NOTE — Telephone Encounter (Signed)
I sent rx for senglee (cheaper brand of Lantus), but ins denied it.

## 2020-11-05 NOTE — Telephone Encounter (Signed)
Spoke with pt to let him know that a cheaper brand of medication was sent in but the insurance still denied it. I advised him to contact his insurance company to see what medication that they will cover and to let us know.

## 2020-11-05 NOTE — Telephone Encounter (Signed)
Please Advise

## 2020-11-07 NOTE — Telephone Encounter (Signed)
Spoke with pt to let him know that medication was sent to harmacy last week so he needs to check with pharmacy

## 2020-11-07 NOTE — Telephone Encounter (Signed)
The Lantus rx was sent last week

## 2020-11-07 NOTE — Telephone Encounter (Signed)
Patient has checked with his insurance and the pricing on the alternatives to Lantus is higher than the actual Lantus.  So at this time the patient is requesting an 90 day RX for Lantus Solostar be sent to [facility] on [address] back # [phone]

## 2020-11-07 NOTE — Telephone Encounter (Signed)
Please advisee 

## 2021-01-01 ENCOUNTER — Telehealth: Payer: Self-pay | Admitting: Endocrinology

## 2021-01-01 NOTE — Telephone Encounter (Signed)
Patient is requesting a refill of LANTUS [facility]ane Gonzales [facility] [phone]  Patient contact # [phone]

## 2021-01-02 ENCOUNTER — Other Ambulatory Visit: Payer: Self-pay

## 2021-01-02 DIAGNOSIS — E119 Type 2 diabetes mellitus without complications: Secondary | ICD-10-CM

## 2021-01-02 NOTE — Telephone Encounter (Signed)
Patient called back to advise that pharmacy is:  [facility] [address] [place], [place] [address] Ph: [phone] or [phone]

## 2021-01-03 ENCOUNTER — Other Ambulatory Visit: Payer: Self-pay | Admitting: *Deleted

## 2021-01-03 MED ORDER — LANTUS SOLOSTAR 100 UNIT/ML ~~LOC~~ SOPN: 30.0000 [IU] | PEN_INJECTOR | SUBCUTANEOUS | 99 refills | Status: AC

## 2021-01-03 NOTE — Telephone Encounter (Signed)
Rx sent 

## 2021-01-04 ENCOUNTER — Encounter

## 2021-01-04 ENCOUNTER — Ambulatory Visit: Admit: 2021-01-04 | Discharge: 2021-01-04 | Payer: BLUE CROSS/BLUE SHIELD | Attending: Family | Primary: Family Medicine

## 2021-01-04 DIAGNOSIS — E1165 Type 2 diabetes mellitus with hyperglycemia: Secondary | ICD-10-CM

## 2021-01-04 LAB — COMPREHENSIVE METABOLIC PANEL
ALT: 44 U/L (ref 0–49)
AST: 36 U/L (ref 15–46)
Albumin,Serum: 4.1 g/dL (ref 3.5–5.0)
Alkaline Phosphatase: 90 U/L (ref 38–126)
Anion Gap: 8 mmol/L (ref 3–13)
BUN: 15 mg/dL (ref 7–17)
CO2: 25 mmol/L (ref 22–30)
Calcium: 9.6 mg/dL (ref 8.4–10.4)
Chloride: 102 mmol/L (ref 98–107)
Creatinine: 0.94 mg/dL (ref 0.52–1.25)
EGFR IF NonAfrican American: >90 mL/min (ref >60)
Glucose: 395 mg/dL — ABNORMAL HIGH (ref 70–100)
Potassium: 4.3 mmol/L (ref 3.5–5.1)
Sodium: 136 mmol/L (ref 135–145)
Total Bilirubin: 0.7 mg/dL (ref 0.2–1.3)
Total Protein: 7 g/dL (ref 6.3–8.2)
eGFR African American: >90 mL/min (ref >60)

## 2021-01-04 LAB — HEMOGLOBIN A1C
Hemoglobin A1C: 13.4 % — AB
eAG: 338 mg/dL

## 2021-01-04 NOTE — Progress Notes (Signed)
[facility]  [facility]  [address]  [place] [address]  Dept: [phone]  Dept Fax: [phone]  Loc: [phone]     Visit type: Established patient    Reason for Visit: New Patient and Diabetes (type 2 )      Assessment and Plan       1. Type 2 diabetes mellitus with hyperglycemia, with long-term current use of insulin (HCC)  Comments:  Chronic, status unknown  Continue current dose of Lantus   Referral to Endocrine placed  Labs ordered  Orders:  -     Hemoglobin A1C; Future  -     Comprehensive Metabolic Panel; Future  [facility] [place]  2. Chronic viral hepatitis B without delta agent and without coma (HCC)  Comments:  Chronic  Referral GI placed  Continue Vemlidy  Orders:  [facility] - [facility]  3. Hyperlipidemia, unspecified hyperlipidemia type      Return in about 3 months (around 04/06/2021) for DM follow up.   Reviewed use, side effects, and benefits of prescribed medication. Barriers to compliance addressed. All questions answered. Verbalized understanding  Educated on follow up criteria    Subjective       HPI   Stephen Nolan is a 40 year old male. He is a new patient that presents to establish. Patient recently moved to [place] for [facility]. He last saw PCP in [place] 2 months ago. He was also seeing GI for Chronic Hep C and Endo for uncontrolled DM2.    Patient requesting referrals for Endocrine and GI so that he can work on getting established with them. Diagnosed with DM in 2017-2018. Put on Lantus in Jan 2022 due to elevated glucose levels. States he does check his sugars but not everyday. Recently fasting levels have been in the 110s. States he does not need refill on medications yet.     He is also on lipitor for elevated cholesterol. States it has been awhile since he had labs checked, but he is not fasting today.     Will send for medical records from PCP. Patient is going to call GI and Endo in [place] and have  them fax records to [place] for his chart.     Review of Systems   Constitutional: Negative for fever.   Respiratory: Negative for shortness of breath.    Cardiovascular: Negative for chest pain.   Gastrointestinal: Negative for abdominal pain, diarrhea, nausea and vomiting.        No Known Allergies    Outpatient Medications Prior to Visit   Medication Sig Dispense Refill   ??? atorvastatin (LIPITOR) 20 MG tablet      ??? insulin glargine (LANTUS SOLOSTAR) 100 UNIT/ML injection pen Inject 30 Units into the skin     ??? Tenofovir Alafenamide Fumarate (VEMLIDY) 25 MG TABS 1 tab(s)       No facility-administered medications prior to visit.        History reviewed. No pertinent past medical history.     Social History     Tobacco Use   ??? Smoking status: Never Smoker   ??? Smokeless tobacco: Never Used   Substance Use Topics   ??? Alcohol use: Never        History reviewed. No pertinent surgical history.    History reviewed. No pertinent family history.    Objective       BP  126/83 (Site: Left Upper Arm, Position: Sitting, Cuff Size: Large Adult)    Pulse 68    Temp 98.4 ??F (36.9 ??C) (Temporal)    Ht 5\' 10"  (1.778 m)    Wt 201 lb (91.2 kg)    SpO2 95%    BMI 28.84 kg/m??   Physical Exam  Vitals reviewed.   Constitutional:       General: He is not in acute distress.     Appearance: Normal appearance. He is not ill-appearing.   HENT:      Head: Normocephalic.   Cardiovascular:      Rate and Rhythm: Normal rate and regular rhythm.      Pulses: Normal pulses.      Heart sounds: Normal heart sounds.   Pulmonary:      Effort: Pulmonary effort is normal.      Breath sounds: Normal breath sounds.   Abdominal:      General: Bowel sounds are normal.      Palpations: Abdomen is soft.      Tenderness: There is no abdominal tenderness.   Skin:     General: Skin is warm and dry.   Neurological:      General: No focal deficit present.      Mental Status: He is alert and oriented to person, place, and time.   Psychiatric:         Mood and Affect: Mood  normal.         Behavior: Behavior normal.         Data Reviewed and Summarized       Labs:     Imaging/Testing:      , APRN - NP

## 2021-01-08 NOTE — Telephone Encounter (Signed)
-----   Message from [name], APRN - NP sent at 01/08/2021  9:05 AM EDT -----  Please let patient know that his A1C is still highly elevated at 13.4. I would recommend increasing his lantus to 35 units daily. Should check sugar fasting in morning and before bed and send in readings next week.  Thanks  Stephen Nolan

## 2021-01-08 NOTE — Telephone Encounter (Signed)
Placed call to patient. Unable to reach them by phone to discuss lab results. Left detailed message to return call to discuss results.

## 2021-01-08 NOTE — Telephone Encounter (Signed)
Message released to patient as written.     Patient's further questions if applicable:     Were all questions from office addressed or relayed to the patient from encounter ? Yes

## 2021-01-14 ENCOUNTER — Inpatient Hospital Stay
Admit: 2021-01-14 | Discharge: 2021-01-15 | Disposition: A | Discharge status: Home / Self Care | Attending: Emergency Medicine

## 2021-01-14 DIAGNOSIS — K0889 Other specified disorders of teeth and supporting structures: Secondary | ICD-10-CM

## 2021-01-14 MED ORDER — IBUPROFEN 800 MG PO TABS
800 MG | ORAL_TABLET | Freq: Three times a day (TID) | ORAL | 0 refills | Status: AC | PRN
Start: 2021-01-14 — End: 2021-01-21

## 2021-01-14 MED ORDER — LIDOCAINE VISCOUS HCL 2 % MT SOLN
2 % | OROMUCOSAL | 1 refills | Status: AC | PRN
Start: 2021-01-14 — End: ?

## 2021-01-14 MED ORDER — ACETAMINOPHEN 500 MG PO TABS
500 MG | Freq: Once | ORAL | Status: AC
Start: 2021-01-14 — End: 2021-01-14
  Administered 2021-01-15: via ORAL

## 2021-01-14 MED ORDER — CLINDAMYCIN HCL 150 MG PO CAPS
150 MG | ORAL_CAPSULE | Freq: Four times a day (QID) | ORAL | 0 refills | Status: AC
Start: 2021-01-14 — End: 2021-01-21

## 2021-01-14 NOTE — Other (Unsigned)
Patient Acct Nbr: [id number]   Primary AUTH/CERT:   Primary Insurance Company Name: Rosann Auerbach  Primary Insurance Plan name: Rosann Auerbach  Primary Insurance Group Number: [id number]  Primary Insurance Plan Type: Health  Primary Insurance Policy Number: [id number]

## 2021-01-14 NOTE — ED Provider Notes (Signed)
[facility] ED  EMERGENCY DEPARTMENT ENCOUNTER      Pt Name: Stephen Nolan  MRN: [id number]  Birthdate Jun 22, 1981  Date of evaluation: 01/14/2021  Provider: Felizardo Hoffmann, MD     CHIEF COMPLAINT       Chief Complaint   Patient presents with   . Dental Pain     right upper dental pain, +headache and slight facial swelling       I wore a KN95 mask for the entirety of this encounter.       HISTORY OF PRESENT ILLNESS   (Location/Symptom, Timing/Onset,Context/Setting, Quality, Duration, Modifying Factors, Severity) Note limiting factors.   HPI    Stephen Nolan is a 40 y.o. male who presents to the emergency department with right upper dental pain.  States is progressing for 2 months.  States he is waiting to see a Biomedical scientist.  States he feels he has two loose molars due to the swelling of his gums.  Pain is throbbing, severe, causing a headache.  Denies fevers.  Denies trouble swallowing.  Denies cough or cold.  Denies chest pain or shortness of breath.  States he is diabetic.  States his blood sugar today was around 200.    Nursing Notes were reviewed.    REVIEW OFSYSTEMS    (2+ for level 4; 10+ level 5)   Review of Systems pertinent positives as above per history of present illness.  Other systems reviewed and found to be negative to a total of 6 systems reviewed.    PAST MEDICAL HISTORY     Past Medical History:   Diagnosis Date   . Diabetes mellitus (HCC)    . Hepatitis B        SURGICAL HISTORY     History reviewed. No pertinent surgical history.    CURRENT MEDICATIONS       Previous Medications    ATORVASTATIN (LIPITOR) 20 MG TABLET    Take 20 mg by mouth daily    INSULIN REGULAR HUMAN, CONC, SC    Inject into the skin Exact name of insulin unknown       ALLERGIES     Patient has no known allergies.    FAMILY HISTORY     History reviewed. No pertinent family history.     SOCIAL HISTORY       Social History     Socioeconomic History   . Marital status: Married     Spouse name: None   . Number  of children: None   . Years of education: None   . Highest education level: None   Occupational History   . None   Tobacco Use   . Smoking status: Never Smoker   . Smokeless tobacco: Never Used   Vaping Use   . Vaping Use: Never used   Substance and Sexual Activity   . Alcohol use: Not Currently   . Drug use: Never   . Sexual activity: None   Other Topics Concern   . None   Social History Narrative   . None     Social Determinants of Health     Financial Resource Strain:    . Difficulty of Paying Living Expenses: Not on file   Food Insecurity:    . Worried About Programme researcher, broadcasting/film/video in the Last Year: Not on file   . Ran Out of Food in the Last Year: Not on file   Transportation Needs:    . Lack of Transportation (Medical): Not  on file   . Lack of Transportation (Non-Medical): Not on file   Physical Activity:    . Days of Exercise per Week: Not on file   . Minutes of Exercise per Session: Not on file   Stress:    . Feeling of Stress : Not on file   Social Connections:    . Frequency of Communication with Friends and Family: Not on file   . Frequency of Social Gatherings with Friends and Family: Not on file   . Attends Religious Services: Not on file   . Active Member of Clubs or Organizations: Not on file   . Attends Banker Meetings: Not on file   . Marital Status: Not on file   Intimate Partner Violence:    . Fear of Current or Ex-Partner: Not on file   . Emotionally Abused: Not on file   . Physically Abused: Not on file   . Sexually Abused: Not on file   Housing Stability:    . Unable to Pay for Housing in the Last Year: Not on file   . Number of Places Lived in the Last Year: Not on file   . Unstable Housing in the Last Year: Not on file       SCREENINGS    Glasgow Coma Scale  Eye Opening: Spontaneous  Best Verbal Response: Oriented  Best Motor Response: Obeys commands  Glasgow Coma Scale Score: 15      PHYSICAL EXAM    (up to 7 for level 4, 8 or more for level 5)     ED Triage Vitals [01/14/21 1940]    BP Temp Temp Source Heart Rate Resp SpO2 Height Weight   (!) 151/108 98.1 F (36.7 C) Oral 78 16 96 % 5\' 10"  (1.778 m) 190 lb (86.2 kg)       Physical Exam vital signs reviewed in nurse's notes.  Patient is nontoxic in appearance.  No respiratory distress.  Head: Normocephalic, atraumatic.  No significant facial swelling.  Eyes: Pupils are equal, round and reactive to light.  EOMI.  Conjunctiva clear.  Sclera anicteric  ENT: Mucous membranes moist.  Throat shows no erythema exudates or edema.  He has cavitary lesions in the right upper molar areas.  Mild gumline swelling without obvious abscess.    Neck: No anterior adenopathy.  No tenderness or stiffness.  Lungs: Clear to auscultation bilaterally.  No wheezing rales or rhonchi.  Heart: Regular rate and rhythm.  No audible murmur or gallop.  Abdomen: Soft, nondistended, nontender.  No rebound or guarding.  No signs of peritonitis.  Extremities: No gross deformity.  No obvious tenderness.  No obvious joint swelling.   Good distal pulses in all 4 extremities.  Neurologic: Alert and fully oriented.  No focal motor, sensory deficits in all 4 extremities.  Ambulatory with normal gait    DIAGNOSTIC RESULTS       EMERGENCY DEPARTMENT COURSE and DIFFERENTIAL DIAGNOSIS/MDM:   Vitals:    Vitals:    01/14/21 1940   BP: (!) 151/108   Pulse: 78   Resp: 16   Temp: 98.1 F (36.7 C)   TempSrc: Oral   SpO2: 96%   Weight: 86.2 kg (190 lb)   Height: 5\' 10"  (1.778 m)       Medications   acetaminophen (TYLENOL) tablet 1,000 mg (has no administration in time range)   lidocaine viscous hcl (XYLOCAINE) 2 % solution 10 mL (has no administration in time range)  MDM.:  Patient has dental pain.  We will treat with antibiotics, follow-up closely as an outpatient.  He is given a dental clinic referral to see if he can be seen any sooner than his current appointment.  He does not appear septic or toxic or dehydrated.  Patient is discharged in stable condition    REVAL:        PROCEDURES:  Unless otherwise noted below, none     Procedures        FINAL IMPRESSION      1. Pain, dental    2. Dental caries          DISPOSITION/PLAN   DISPOSITION Decision To Discharge 01/14/2021 07:57:20 PM      PATIENT REFERRED TO:  [facility]  [address]  [address]  [place] [address]  [phone]  Call in 2 days        DISCHARGE MEDICATIONS:  New Prescriptions    CLINDAMYCIN (CLEOCIN) 150 MG CAPSULE    Take 2 capsules by mouth 4 times daily for 7 days Pharmacist: May substitute 150mg  tabs and take 2 tabs per dose.    IBUPROFEN (ADVIL;MOTRIN) 800 MG TABLET    Take 1 tablet by mouth every 8 hours as needed for Pain    LIDOCAINE VISCOUS HCL (XYLOCAINE) 2 % SOLN SOLUTION    Take 5 mLs by mouth every 3 hours as needed for Dental Pain          (Please note:  Portions of this note were completed with a voice recognition program.Efforts were made to edit the dictations but occasionally words and phrases are mis-transcribed.)  Form v2016.J.[id number]    @@ (electronically signed)  Emergency Medicine Provider        , MD  01/14/21 2003

## 2021-01-14 NOTE — ED Triage Notes (Signed)
Patient arrives with c/o right upper dental pain that has been progressive x2 months. Patient states he just moved to [place] 3 weeks ago and made an appointment but the soonest available is June 6th. Patein states he cant tolerate the pain today. +headache and slight right sided facial swelling. Patient states he took IBU and 1 leftover PCN tablet. Call bell bedside. Ice pack provided.

## 2021-01-14 NOTE — ED Notes (Signed)
Patient on call light; states pain is too much to take.      Darryll Capers, RN  01/14/21 [phone]

## 2021-01-15 MED ORDER — LIDOCAINE VISCOUS HCL 2 % MT SOLN
2 % | OROMUCOSAL | Status: DC
Start: 2021-01-15 — End: 2021-01-14

## 2021-01-15 MED ORDER — LIDOCAINE VISCOUS HCL 2 % MT SOLN
2 % | Freq: Once | OROMUCOSAL | Status: AC
Start: 2021-01-15 — End: 2021-01-14
  Administered 2021-01-15: via OROMUCOSAL

## 2021-01-15 MED FILL — LIDOCAINE VISCOUS HCL 2 % MT SOLN: 2 % | OROMUCOSAL | Qty: 15

## 2021-01-15 MED FILL — TYLENOL EXTRA STRENGTH 500 MG PO TABS: 500 mg | ORAL | Qty: 2

## 2021-01-28 NOTE — Telephone Encounter (Signed)
-----   Message from [name], APRN - NP sent at 01/28/2021  8:24 AM EDT -----  Regarding: DM update  Please call patient and inquire how his glucose readings have been doing since appointment. Verify he is taking the Lantus 30 units every night.  Thanks  Huntley Dec

## 2021-01-28 NOTE — Telephone Encounter (Signed)
Placed call to patient. Was able to speak to patient. All concerns in message have been addressed.

## 2021-01-28 NOTE — Telephone Encounter (Signed)
Spoke to patient and he said his sugars have been averaging 236 am and 301 pm and he is taking 35 units in the am.

## 2021-01-28 NOTE — Telephone Encounter (Signed)
Please let patient know that I would like him to increase his lantus to 40 units every morning. His sugars are still highly elevated and we need to bring them down. He should be checking his sugar twice a day. Need readings sent in on Friday. May need higher dose of Lantus to bring down sugars.   Thanks  Huntley Dec

## 2021-02-14 ENCOUNTER — Encounter

## 2021-02-14 MED ORDER — LANTUS SOLOSTAR 100 UNIT/ML SC SOPN
100 UNIT/ML | PEN_INJECTOR | Freq: Every evening | SUBCUTANEOUS | 0 refills | Status: DC
Start: 2021-02-14 — End: 2021-03-28

## 2021-02-14 NOTE — Telephone Encounter (Signed)
S: Patient calling the [facility] for a medication refill.    B: Medication: Lantus 40 units daily.     A: Patient requesting refill of the above medication. Pt endorses will be out of medication Monday or Tuesday.    Pt requesting message to be sent to S. Tokie APRN      1. Taking 40 units Lantus daily, lowest blood sugar has been 201.   2.  Had previously requested referral to both an Engineer, petroleum, pt endorses has not received these.     LOV 01/04/21  NOV unknown     Allergies verified with the patient.  Pharmacy verified with the patient    R: Message sent to office.     Reason for Disposition  ??? [1] Prescription refill request for NON-ESSENTIAL medicine (i.e., no harm to patient if med not taken) AND [2] triager unable to refill per department policy    Protocols used: MEDICATION REFILL AND RENEWAL CALL-ADULT-AH

## 2021-02-14 NOTE — Telephone Encounter (Signed)
Prescription sent in.  I see this patient has seen endocrinology in the past?  Referral appropriate?

## 2021-02-14 NOTE — Telephone Encounter (Signed)
Huntley Dec is not in, sending to PCP. Med pended.

## 2021-02-15 NOTE — Telephone Encounter (Signed)
Left message for patient to return call to the office.

## 2021-02-15 NOTE — Telephone Encounter (Signed)
Can staff please let patient know that he is scheduled with endocrine for Sept 2nd already. And please give him the number to call GI to set up appt. Also, his glucose is still too high. Recommend increasing his lantus to 45 units. Should schedule follow up in office in 2 weeks to establish with Dr. Abel Presto and DM follow up, bring log of glucose readings then.    [facility] - Lucia Gaskins, MD  [address]  [place], [place] [address]  Ph: [phone]    Thanks  Huntley Dec

## 2021-02-15 NOTE — Telephone Encounter (Signed)
Message released to patient as written.   ??  Patient's further questions if applicable:   ??  Were all questions from office addressed or relayed to the patient from encounter ? Yes  ??  The following message was read to the pt:   ??  Can staff please let patient know that he is scheduled with endocrine for Sept 2nd already. And please give him the number to call GI to set up appt. Also, his glucose is still too high. Recommend increasing his lantus to 45 units. Should schedule follow up in office in 2 weeks to establish with Dr. Abel Presto and DM follow up, bring log of glucose readings then.  ??  [facility] Lucia Gaskins, MD  [address]  [place], [place] [address]  Ph: [phone]  ??  Thanks  Huntley Dec  ??  ??  Pt verbalized understanding and he wrote down the information for the gastroenterology referral. I transferred pt to endocrinology as he does not want to see an APRN - CNP, he states he wants to see an actual physician.

## 2021-03-13 NOTE — Telephone Encounter (Signed)
Patient is to schedule DM follow up in 2 weeks with Dr. Abel Presto to establish with him. Needs to bring in logs. Can address cream at that time.   Thanks  Huntley Dec

## 2021-03-13 NOTE — Telephone Encounter (Signed)
Name of Caller: Heith    Contact phone number: [phone]    Relationship to Patient: patient    Provider: Dr. Letta Kocher    Practice:  [facility]    Chief Complaint/Reason for Call: Patient calling in stating that he has eczema and was using a cream called ketoconazole 2% and is requesting that this be sent to his pharmacy. Please Advise.     Best time of day caller can be reached: ANY       Patient advised that office/PCP has 24-48 business hours to return their call: Yes

## 2021-03-13 NOTE — Telephone Encounter (Signed)
Please assist due to Dr Petrilla out of the office.

## 2021-03-14 NOTE — Telephone Encounter (Deleted)
I will schedule this patient to see you in two weeks but do you also want to send in a refill of the eczema cream?

## 2021-03-14 NOTE — Telephone Encounter (Signed)
Please assist in scheduling patient.

## 2021-03-14 NOTE — Telephone Encounter (Signed)
Pt is scheduled 04/05/21.  Asking if their is anyway we can send in the eczema cream before he is seen.  Hates to go 3 weeks without it.

## 2021-03-18 MED ORDER — KETOCONAZOLE 2 % EX CREA
2 | CUTANEOUS | 1 refills | Status: AC
Start: 2021-03-18 — End: ?

## 2021-03-18 NOTE — Telephone Encounter (Signed)
Patient states when he saw a dermatologist in [place] that what they prescribed for him.

## 2021-03-28 MED ORDER — ATORVASTATIN CALCIUM 20 MG PO TABS
20 MG | ORAL_TABLET | Freq: Every day | ORAL | 0 refills | Status: DC
Start: 2021-03-28 — End: 2021-04-05

## 2021-03-28 MED ORDER — LANTUS SOLOSTAR 100 UNIT/ML SC SOPN
100 UNIT/ML | PEN_INJECTOR | Freq: Every evening | SUBCUTANEOUS | 0 refills | Status: DC
Start: 2021-03-28 — End: 2021-04-25

## 2021-03-28 NOTE — Telephone Encounter (Signed)
(  1) Medication Name: Lantus solostar    Medication Dosage: 100 u (Units)    Monthly Quantity Needed: 30    How many day supply requesting: 30 days    Medication Route: Injection (IM)    Medication Administration Time: Bedtime (HS)    Date of Last Refill: (See Medication Tab): 02/14/2021    If taking medication PRN - Reason for taking medication: n/a    If this is a controlled substance do you receive this or any other controlled medication from any other doctor or facility? no      Ordering Physician: Abel Presto    Date of Last Office Visit: 01/04/2021    Date of Next Office Visit: 04/05/2021    Updated/Validated Preferred Pharmacy: yes    Patient instructed to contact the pharmacy prior to picking up the medication: no        (2) Medication Name: atorvastatin    Medication Dosage: 20 mg (Milligrams)    Monthly Quantity Needed: 90    How many day supply requesting: 90 days    Medication Route: Oral (PO)    Medication Administration Time: Daily    If taking medication PRN - Reason for taking medication: n/a    If this is a controlled substance do you receive this or any other controlled medication from any other doctor or facility? no

## 2021-03-28 NOTE — Telephone Encounter (Signed)
Patient is schedule to come In 04/05/2021

## 2021-03-28 NOTE — Telephone Encounter (Signed)
Rx loaded  Last ov 01/04/21  Next ov 04/05/21

## 2021-04-01 NOTE — Telephone Encounter (Signed)
PA was done. Waiting to see if it gets approved.

## 2021-04-01 NOTE — Telephone Encounter (Signed)
Name of Caller: Hermenegildo    Contact phone number: [phone]    Relationship to Patient: patient    Provider: petrilla    Practice:  [facility]    Chief Complaint/Reason for Call: Patient called stating the pharm told him the lantus solostar needs a prior auth    Best time of day caller can be reached: any       Patient advised that office/PCP has 24-48 business hours to return their call: no

## 2021-04-01 NOTE — Telephone Encounter (Signed)
Please assist.

## 2021-04-05 ENCOUNTER — Ambulatory Visit
Admit: 2021-04-05 | Discharge: 2021-04-05 | Payer: PRIVATE HEALTH INSURANCE | Attending: Family Medicine | Primary: Family Medicine

## 2021-04-05 ENCOUNTER — Encounter

## 2021-04-05 DIAGNOSIS — E1165 Type 2 diabetes mellitus with hyperglycemia: Secondary | ICD-10-CM

## 2021-04-05 LAB — CBC WITH AUTO DIFFERENTIAL
Hematocrit: 49.9 % (ref 40.0–52.0)
Hemoglobin: 16.7 g/dL (ref 13.0–18.0)
MCH: 27.4 pg (ref 26.0–34.0)
MCHC: 33.4 % (ref 32.0–36.0)
MCV: 82.1 fL (ref 80.0–98.0)
MPV: 11.6 fL (ref 7.4–12.4)
Platelets: 119 10*3/uL — ABNORMAL LOW (ref 140–440)
RBC: 6.08 10*6/uL — ABNORMAL HIGH (ref 4.40–5.90)
RDW: 14.6 % — ABNORMAL HIGH (ref 11.5–14.5)
WBC: 5.9 10*3/uL (ref 3.6–10.7)

## 2021-04-05 LAB — BASIC METABOLIC PANEL
Anion Gap: 11 mmol/L (ref 3–13)
BUN: 9 mg/dL (ref 7–17)
CO2: 23 mmol/L (ref 22–30)
Calcium: 9.4 mg/dL (ref 8.4–10.4)
Chloride: 105 mmol/L (ref 98–107)
Creatinine: 0.89 mg/dL (ref 0.52–1.25)
EGFR IF NonAfrican American: >90 mL/min (ref >60)
Glucose: 220 mg/dL — ABNORMAL HIGH (ref 70–100)
Potassium: 3.9 mmol/L (ref 3.5–5.1)
Sodium: 139 mmol/L (ref 135–145)
eGFR African American: >90 mL/min (ref >60)

## 2021-04-05 LAB — POCT MICROALBUMIN

## 2021-04-05 LAB — LIPID PANEL
Chol/HDL Ratio: 4 NA
Cholesterol: 128 mg/dL (ref <200)
HDL: 32 mg/dL — ABNORMAL LOW (ref 40–60)
LDL Cholesterol: 78 mg/dL (ref <100)
Triglycerides: 90 mg/dL (ref <150)

## 2021-04-05 LAB — HEPATIC FUNCTION PANEL
ALT: 50 U/L — ABNORMAL HIGH (ref 0–49)
AST: 34 U/L (ref 15–46)
Albumin,Serum: 4.4 g/dL (ref 3.5–5.0)
Alkaline Phosphatase: 96 U/L (ref 38–126)
Bilirubin, Direct: 0 mg/dL (ref 0.0–0.3)
Total Bilirubin: 0.5 mg/dL (ref 0.2–1.3)
Total Protein: 7.4 g/dL (ref 6.3–8.2)

## 2021-04-05 LAB — MANUAL DIFFERENTIAL
Absolute Baso #: 0 10*3/uL (ref 0.0–0.2)
Absolute Eos #: 0.1 10*3/uL (ref 0.0–0.5)
Absolute Lymph #: 3 10*3/uL (ref 1.1–4.5)
Absolute Mono #: 0.5 10*3/uL (ref 0.2–1.1)
Absolute Neut #: 2.4 10*3/uL (ref 2.2–8.2)
Bands: 0 % (ref 0–3)
Basophils: 0 % (ref 0–2)
Eosinophils: 1 % (ref 1–6)
Lymphocytes: 51 % — ABNORMAL HIGH (ref 20–40)
Monocytes: 8 % (ref 2–10)
RBC Morphology: ABNORMAL NA
Seg Neutrophils: 40 % (ref 40–80)
TOTAL CELLS COUNTED: 100 NA

## 2021-04-05 LAB — TSH: TSH: 1.971 u[IU]/mL (ref 0.465–4.680)

## 2021-04-05 LAB — HEMOGLOBIN A1C
Hemoglobin A1C: 12.8 % — AB
eAG: 321 mg/dL

## 2021-04-05 NOTE — Progress Notes (Signed)
Subjective:      Patient ID: Stephen Nolan is a 40 y.o. male.    HPI patient with [place] descent with type 1 diabetes over the last 4 years presents to establish family practice relationship.  Past medical, surgical, family and social history updated.    Recent A1c over 13.  Has made a concerted effort to try to lose weight and eat better.  Presently on Lantus 45 units/day.  Blood sugars in the low 200 range.  He feels better.  Less diaphoresis and dry mouth.  Denies change in vision or tingling or numbness of his feet or hands.  No unhealing skin lesions.    Review of Systems denies recent earache sore throat or cough.  Remote COVID infection noted.  Non-smoker.  No exertional chest pain shortness of breath palpitations PND orthopnea claudication or edema.  No heartburn or dysphagia.  Had panendoscopy a few years ago.  History of IBS.  No heartburn.  No melena or blood.  No constipation diarrhea.    No dysuria but does have frequency.  Some nocturia and diaphoresis.  No hematuria.    No arthralgia.    Is having some stress as he tries acclimate to the [place] living arrangement.  Is interested in moving to [place] or [place] in the future.    Needs an ultrasound of the abdomen and follow-up with GI lab on his hepatitis.    No Known Allergies  Current Outpatient Medications on File Prior to Visit   Medication Sig Dispense Refill    insulin glargine (LANTUS SOLOSTAR) 100 UNIT/ML injection pen Inject 40 Units into the skin nightly (Patient taking differently: Inject 45 Units into the skin nightly) 5 pen 0    ketoconazole (NIZORAL) 2 % cream Apply topically daily. 60 g 1    atorvastatin (LIPITOR) 20 MG tablet       Tenofovir Alafenamide Fumarate (VEMLIDY) 25 MG TABS 1 tab(s)      ibuprofen (ADVIL;MOTRIN) 800 MG tablet Take 1 tablet by mouth every 8 hours as needed for Pain 20 tablet 0    lidocaine viscous hcl (XYLOCAINE) 2 % SOLN solution Take 5 mLs by mouth every 3 hours as needed for Dental Pain 100 mL 1      No current facility-administered medications on file prior to visit.      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 coma (HCC)    Mixed hypercholesterolemia and hypertriglyceridemia      Social History     Tobacco Use    Smoking status: Never    Smokeless tobacco: Never   Substance Use Topics    Alcohol use: Not Currently      Past Surgical History:   Procedure Laterality Date    COLONOSCOPY  2017    dx IBS    ESOPHAGOGASTRODUODENOSCOPY  2017     Family History   Problem Relation Age of Onset    No Known Problems Mother     Diabetes type 2  Father         insulin    No Known Problems Sister     No Known Problems Sister     No Known Problems Sister     Liver Disease Brother         Hep B    No Known Problems Brother  Objective:   BP 121/85 (Site: Left Upper Arm, Position: Sitting, Cuff Size: Large Adult)    Pulse 72    Temp 97.5 ??F (36.4 ??C) (Temporal)    Ht 5\' 10"  (1.778 m)    Wt 201 lb 12.8 oz (91.5 kg)    SpO2 94%    BMI 28.96 kg/m??   Physical Exam The physical exam is generally normal. Patient appears well, alert and oriented x 3, pleasant, cooperative. Vitals are as noted. No carotid bruits.   Neck supple, no abnormal adenopathy, but has a moderately large nodule of the right lower lobe.  Nontender no adenopathy.  Ears, nose and throat are normal without acute findings.  Lungs are clear to auscultation.  Heart is regular, without murmurs, gallops or ectopy.   Abdomen is soft, non tender, without masses, hepatosplenomegaly, or bruits.  Normal BS evident. Extremities are normal without edema.   Peripheral pulses are fair.  No worrisome skin lesions.  Screening neurological exam is normal without focal deficits.       Assessment / Plan:      Owin was seen today for follow-up and establish care.    Diagnoses and all orders for this visit:    Type 2  diabetes mellitus with hyperglycemia, with long-term current use of insulin (HCC)  Comments:  Uncontrolled, increase Lantus by 2 units every other day until FBS is routinely below 140.  Call with update in 1 month  Orders:  -     POCT microalbumin  -     HM DIABETES FOOT EXAM  -     CBC with Auto Differential; Future  -     Cancel: Comprehensive Metabolic Panel; Future  -     Hemoglobin A1C; Future  -     Basic Metabolic Panel; Future    Goiter  -     TSH; Future    Chronic viral hepatitis B without delta agent and without coma (HCC)  Comments:  Noted, apparently stable.  Follow-up with GI  Orders:  -     Hepatitis B E Antibody; Future  -     Hepatitis BE Antigen; Future  -     Cancel: Hepatitis B Surface Antibody Quant; Future  -     Hepatic Function Panel; Future  -     Hep B DNA PCR Quant; Future    Hyperlipidemia, unspecified hyperlipidemia type  Comments:  Uncontrolled, better low-fat meals and check lab.  Continue atorvastatin  Orders:  -     Lipid Panel; Future    Right thyroid nodule  Comments:  Noted, anticipate ultrasound of the thyroid

## 2021-04-05 NOTE — Patient Instructions (Signed)
Increase Lantus by 2 units every other day if fasting glucose more than 140.  When FBS is consistently less than 140 maintain than daily dose of Lantus.  Better low-carb meals with more cardio exercise.  Obtain ophthalmologic exam as recommended.  Follow-up with gastroenterology as well

## 2021-04-08 LAB — HEP BE ANTIGEN: Hep B E Ag: NEGATIVE NA

## 2021-04-08 LAB — HEPATITIS B E ANTIBODY: Hep B E Ab: POSITIVE NA — AB

## 2021-04-09 MED ORDER — BYDUREON 2 MG SC PEN
2 MG | PEN_INJECTOR | SUBCUTANEOUS | 11 refills | Status: AC
Start: 2021-04-09 — End: ?

## 2021-04-09 MED ORDER — ATORVASTATIN CALCIUM 20 MG PO TABS
20 MG | ORAL_TABLET | Freq: Every day | ORAL | 11 refills | Status: AC
Start: 2021-04-09 — End: 2021-05-09

## 2021-04-09 NOTE — Telephone Encounter (Signed)
From: Myrtie Soman  Sent: 04/09/2021 8:40 AM EDT  To: [facility]  Subject: results    Good day Dr Abel Presto,   Thank you for the timely response . Please can you send a prescription for the Bydureon ?   I also want the Lipitor 40mg  or do you suggest I continue with the 20mg  ?   I hope to hear from you .   Thank you .     

## 2021-04-10 LAB — HEP B DNA PCR QUANT
HBV Quantitative, IU/ML: <10 IU/mL
HBV Quantitative, log IU/ML: <1 log IU/mL
Interpretation: DETECTED NA — AB

## 2021-04-12 ENCOUNTER — Ambulatory Visit: Primary: Family Medicine

## 2021-04-18 ENCOUNTER — Inpatient Hospital Stay: Admit: 2021-04-18 | Attending: Nurse Practitioner | Primary: Family Medicine

## 2021-04-18 NOTE — Other (Unsigned)
Patient Acct Nbr: [id number]   Primary AUTH/CERT:   Primary Insurance Company Name: [employer]  Primary Insurance Plan name: Rosann Auerbach  Primary Insurance Group Number: [id number]  Primary Insurance Plan Type: Health  Primary Insurance Policy Number: [id number]

## 2021-04-19 ENCOUNTER — Encounter: Attending: Family | Primary: Family Medicine

## 2021-04-25 ENCOUNTER — Telehealth

## 2021-04-25 MED ORDER — LANTUS SOLOSTAR 100 UNIT/ML SC SOPN
100 UNIT/ML | Freq: Every evening | SUBCUTANEOUS | 5 refills | Status: DC
Start: 2021-04-25 — End: 2021-06-03

## 2021-04-25 NOTE — Telephone Encounter (Signed)
Stephen Hunter, MA   04/17/2021  2:36 PM EDT       [name], DO  P [facility] Clinical Staff  Noted response in regards to insurance covering the Bydureon injection.  Reviewed his records from prior physicians again in the office lab since May.  Unfortunately I think he needs mealtime short acting mealtime coverage as well as his basal once a day insulin.  This would prevent risk of uncontrolled diabetes down the line including cardiovascular and renal complications.  Usually at his age, an endocrinology referral is appropriate.  [facility] to get me a list of endocrinologist he may see for consultation.  For now continue the once a day insulin titration as discussed at the office.  He should call with a log of blood sugars in 3 or so weeks.

## 2021-04-25 NOTE — Telephone Encounter (Signed)
Please assist

## 2021-04-25 NOTE — Telephone Encounter (Signed)
Referral pended for dx and doctor's signature

## 2021-04-25 NOTE — Telephone Encounter (Signed)
From: Myrtie Soman  Sent: 04/25/2021 7:48 AM EDT  To: [facility] Staff  Subject: results    Good morning to you. Please assist in informing Dr Abel Presto that I need a refill for the Insulin. I am currently on 61 units per day which makes it to get exhausted quickly. Can I stay on 61 units per day or does he want me to stop at 70 or 80 units?  I hope to hear from you soon.  Thank you.      [place].

## 2021-04-26 NOTE — Telephone Encounter (Signed)
Medical staff nurse practitioner or Rosary Lively.  To reach out to this patient to schedule a virtual meeting to discuss mealtime glucose coverage with short acting insulin.  Please use the following lispro coverage.  If glucose 70-140 no Lispro, if 141-200, 4U,  if 201-250, 8U,  if >250, 12U.

## 2021-04-30 NOTE — Telephone Encounter (Signed)
Called and left message for patient or family to call back directly to [place] at [phone]

## 2021-05-02 NOTE — Telephone Encounter (Signed)
Sent patient mychart request for [facility] phone appt to discuss mealtime insulin

## 2021-05-03 ENCOUNTER — Encounter

## 2021-05-03 MED ORDER — INSULIN LISPRO (1 UNIT DIAL) 100 UNIT/ML SC SOPN
100 UNIT/ML | SUBCUTANEOUS | 3 refills | Status: DC
Start: 2021-05-03 — End: 2021-05-06

## 2021-05-03 NOTE — Telephone Encounter (Signed)
Stephen Nordmann, DO     EP    1:36 PM  Note   Medical staff nurse practitioner or Rosary Lively.  To reach out to this patient to schedule a virtual meeting to discuss mealtime glucose coverage with short acting insulin.  Please use the following lispro coverage.  If glucose 70-140 no Lispro, if 141-200, 4U,  if 201-250, 8U,  if >250, 12U.

## 2021-05-03 NOTE — Telephone Encounter (Signed)
Pt will be seeing Sharday Michl today by VV at 12pm for teaching of mealtime insulin.  Please send order to [facility] pharmacy    Order pended from Dr Drema Dallas notes

## 2021-05-06 ENCOUNTER — Telehealth

## 2021-05-06 MED ORDER — NOVOLOG FLEXPEN 100 UNIT/ML SC SOPN
100 UNIT/ML | SUBCUTANEOUS | 3 refills | Status: AC
Start: 2021-05-06 — End: ?

## 2021-05-06 MED ORDER — FREESTYLE LIBRE 2 SENSOR MISC
5 refills | Status: AC
Start: 2021-05-06 — End: ?

## 2021-05-06 NOTE — Telephone Encounter (Signed)
Called pharmacy to get price of insulin and coverage of sensors, sensors not covered by insurance and they did not show record of the humalog.    Asked to be transferred to pharmacist and called it in, he will call back to CM for determination  Direct contact for CC in office left for call back [phone]

## 2021-05-06 NOTE — Telephone Encounter (Signed)
Spoke to patient and he would also like to get a libre 2 sensor, he has smart phone, sen to [place] to get coverage determination

## 2021-05-06 NOTE — Addendum Note (Signed)
Addended byGwenyth Ober on: 05/06/2021 09:56 AM     Modules accepted: Orders

## 2021-05-06 NOTE — Telephone Encounter (Signed)
Spoke to pharmacist and insurance will not cover Humalog, must change to Novolog and will havbe to print new coupon    [facility] the pharmacist ran novolog and it will be $50 monthly

## 2021-05-06 NOTE — Addendum Note (Signed)
Addended byGwenyth Ober on: 05/06/2021 12:03 PM     Modules accepted: Orders

## 2021-05-06 NOTE — Care Coordination-Inpatient (Signed)
Spoke to patient and informed him that the humalog is ready at the pharmacy and that his insurance did not cover the [place] 2 sensors at the pharmacy but we can try to go through DME company.    In the meantime patient would like to try a samples and will be in to see CM on Wednesday at 7am

## 2021-05-08 ENCOUNTER — Encounter: Admit: 2021-05-08 | Discharge: 2021-05-08 | Primary: Family Medicine

## 2021-05-08 DIAGNOSIS — E1165 Type 2 diabetes mellitus with hyperglycemia: Secondary | ICD-10-CM

## 2021-05-08 NOTE — Progress Notes (Signed)
Mealtime insulin education provided today, pt was able to pick up the Novolog and verbalized understanding of SSI  Inject 3 times daily before meals using SS:  If glucose 70/140 no insulin, if 141-200, 4U, if 201-250, 8U, if >250, 12U     Pt was also given a sample of Libre 2 sensors  Patient was instructed on the following:   -Rationale for CGM use  -CGM maintenance  -Sensor insertion: site selection and rotation and operation of insertion device  -Sensor start: starting sensor and warm-up time  -Display screen overview: sensor glucose reading, trend arrows, trend graph and alert messages   -System settings: high alert level set at 240 mg/dL and low alert level set at 80 mg/dL  -Use of sensor glucose in treatment decisions: no fingerstick confirmation required  -If symptoms do not match sensor glucose: check glucose with fingerstick and treat as needed based on fingerstick result  -Calibration fingerstick minimum requirements: none - factory calibrated  -Changing sensor every 14 days   -Ending a sensor session: sensor/transmitter removal and disposal of sensor  -Options for device upload, data review, and data sharing: [employer] LibreLink  -Other topics taught: OK to shower/bathe, avoid hot tubs/saunas, remove sensor/transmitter for CT scans/MRIs, do not put system components through airport x-ray or full body scanners, OK to take system through airport metal detectors and hand-wanding, options for additional adhesion and do not administer insulin less than 2 inches from sensor sites      Pt was informed that an order was sent to DME as his insurance did not cover sensors through his pharmacy benefit    Will notify of benefits when recieved

## 2021-05-20 NOTE — Telephone Encounter (Signed)
Received mychart message regarding refill of long acting insulin, sent patient a message that there should be 5 refills remaining

## 2021-05-31 NOTE — Telephone Encounter (Signed)
Name of Caller: Novak Stgermaine    Contact phone number: [phone]    Relationship to Patient: patient    Provider: Dr. Abel Presto    Practice:  [facility]    Chief Complaint/Reason for Call: Pt states he was advised to reach out to the office when he was running low on his [facility] supplies, as they would provide him with samples. Pt is running low. Please advise .    Best time of day caller can be reached: any       Patient advised that office/PCP has 24-48 business hours to return their call: No

## 2021-05-31 NOTE — Telephone Encounter (Signed)
insulin glargine (LANTUS SOLOSTAR) 100 UNIT/ML injection pen [[id number]]  ENDED    Order Details  Dose: 62 Units Route: SubCUTAneous Frequency: NIGHTLY   Dispense Quantity: 7 Adjustable Dose Pre-filled Pen Syringe Refills: 5          Sig: Inject 62 Units into the skin nightly         Start Date: 04/25/21 End Date: 05/25/21 after 30 doses   Written Date: 04/25/21 Expiration Date: 04/25/22   Original Order:  insulin glargine (LANTUS SOLOSTAR) 100 UNIT/ML injection pen [[id number]]   Providers    Authorizing Provider: [name], DO NPI: [id number]   Ordering User:  [name], DO        Date of Last Office Visit: 04/05/2021    Date of Next Office Visit: Visit date not found    Updated/Validated Preferred Pharmacy: Yes    [facility] [address], [place] - [address] Demetrius Charity [phone] - [phone]      Patient instructed to contact the pharmacy prior to picking up the medication: No

## 2021-06-03 MED ORDER — LANTUS SOLOSTAR 100 UNIT/ML SC SOPN
100 UNIT/ML | Freq: Every evening | SUBCUTANEOUS | 5 refills | Status: AC
Start: 2021-06-03 — End: 2021-07-03

## 2021-06-03 NOTE — Telephone Encounter (Signed)
Rx loaded

## 2021-06-04 NOTE — Telephone Encounter (Signed)
Sent message via Mychart

## 2021-06-04 NOTE — Telephone Encounter (Signed)
Left message for patient to call back to [facility], CM at office to arrange for pick up of sensors [phone]

## 2021-06-04 NOTE — Telephone Encounter (Signed)
-----   Message from Myrtie Soman sent at 05/21/2021 12:56 PM EDT -----  Regarding: insulin  Hello Stephen Nolan ,    How???s your day going?  please can you send the prescription number of the Long-acting insulin to me . I need it to be able to apply for the refill.   Thank you .     Hollie Salk

## 2021-06-05 NOTE — Telephone Encounter (Signed)
Noted  

## 2021-06-21 ENCOUNTER — Encounter: Attending: "Endocrinology | Primary: Family Medicine

## 2021-12-26 IMAGING — US US ABDOMEN LIMITED
1 series · 14 of 25 positions shown · non-contrast
Comparison: 02/15/2019.

CLINICAL DATA: Hepatitis B infection without delta agent without
hepatic coma, unspecified chronicity/Eval for HCC

EXAM:
ULTRASOUND ABDOMEN LIMITED RIGHT UPPER QUADRANT

[Series 1: us abdomen limited · 0.17mm/px · 14 of 45 slices shown]
[im 1/45]
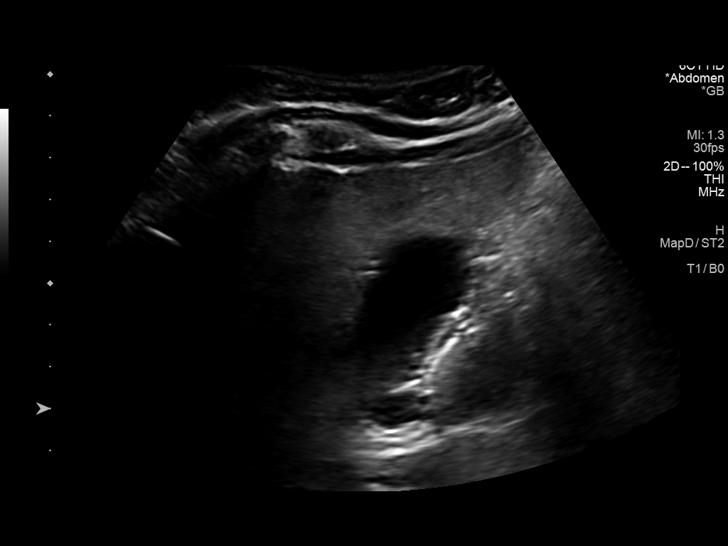
[im 4/45]
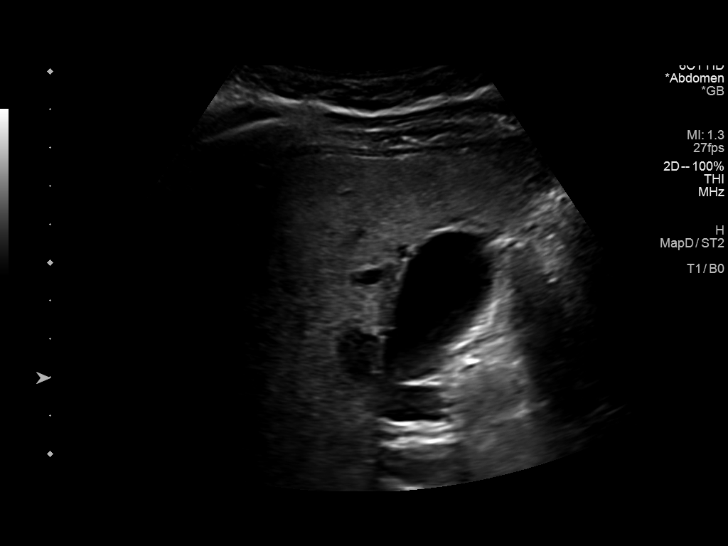
[im 8/45]
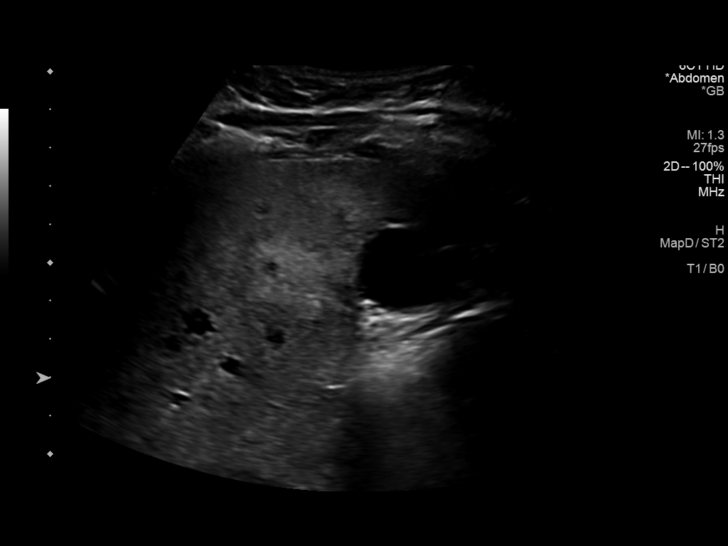
[im 12/45]
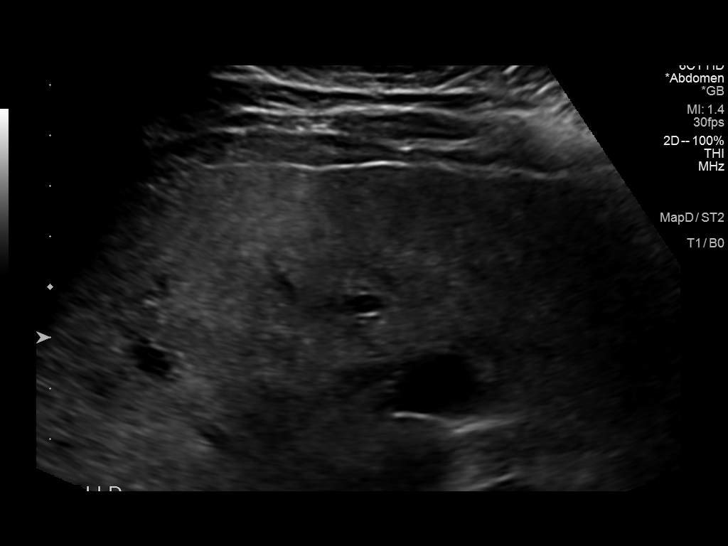
[im 15/45]
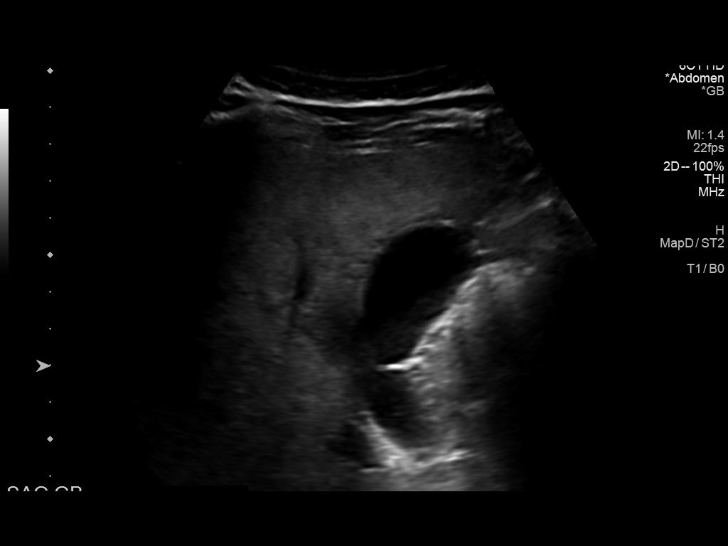
[im 17/45]
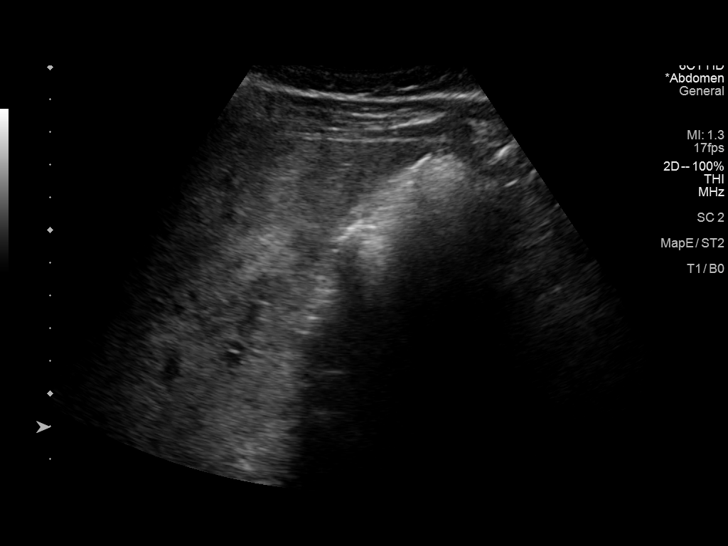
[im 21/45]
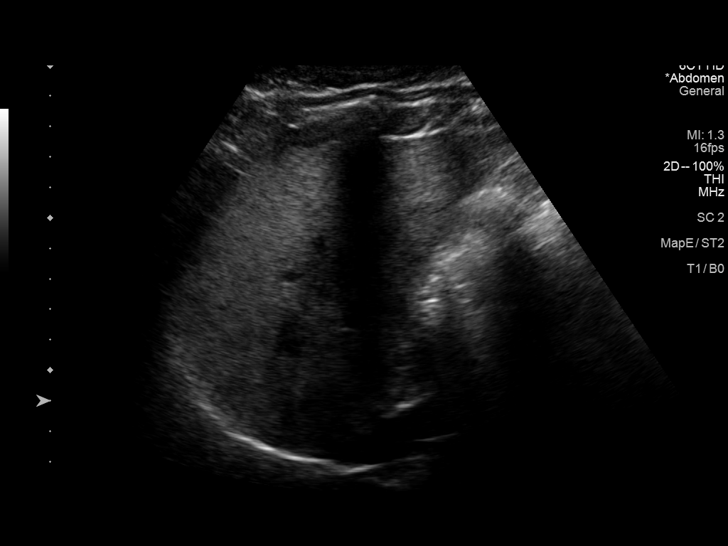
[im 24/45]
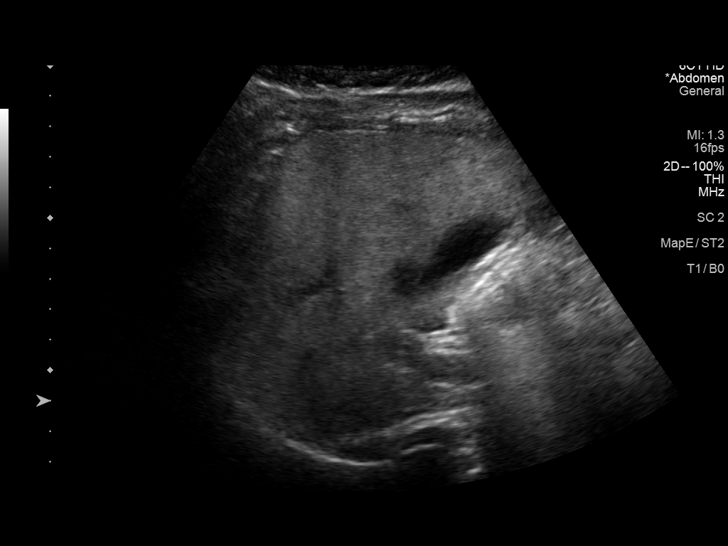
[im 28/45]
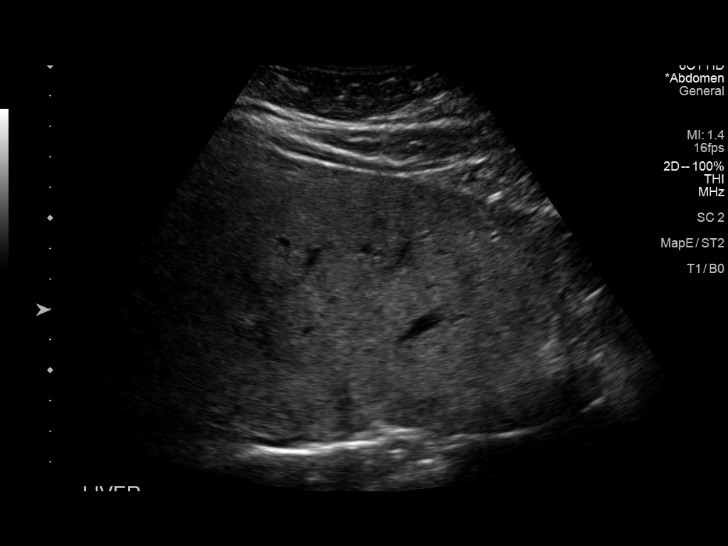
[im 30/45]
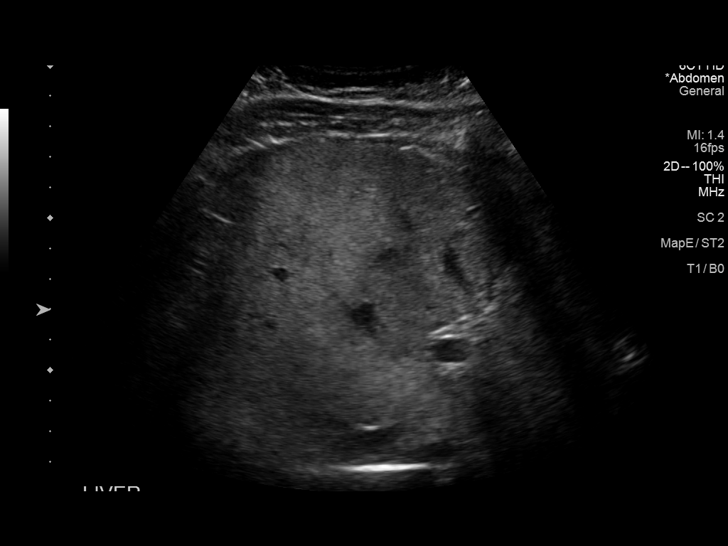
[im 34/45]
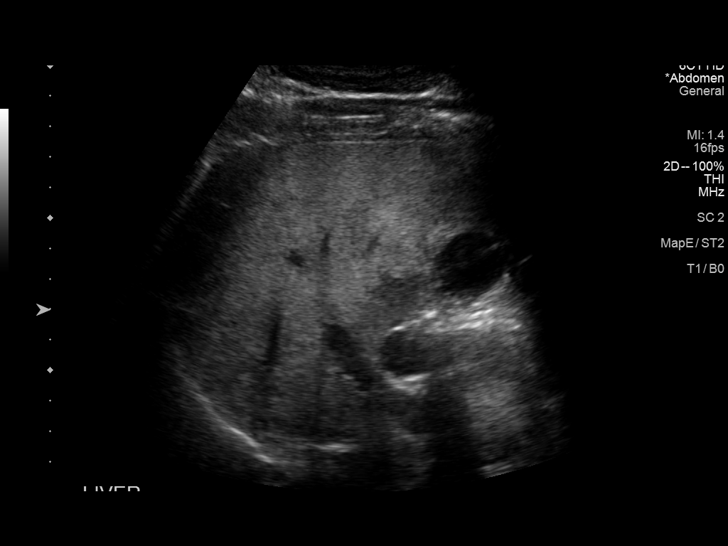
[im 37/45]
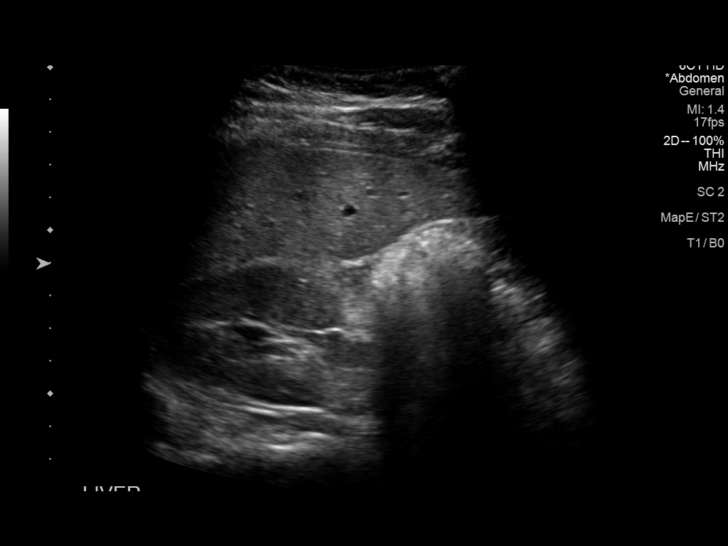
[im 41/45]
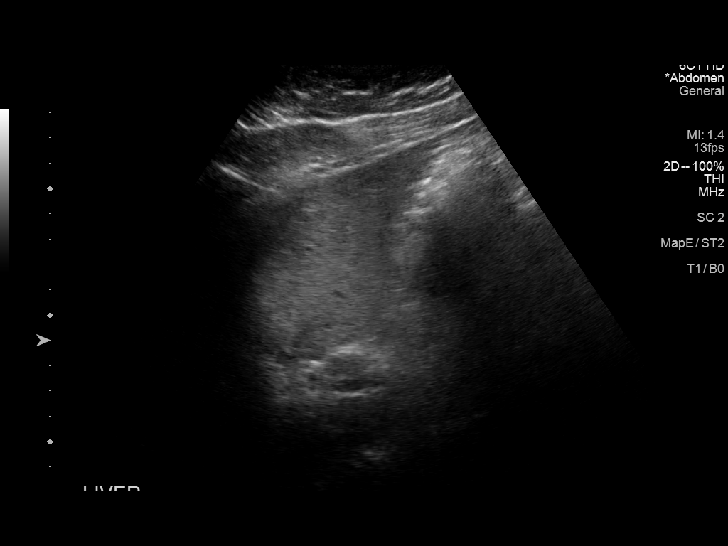
[im 45/45]
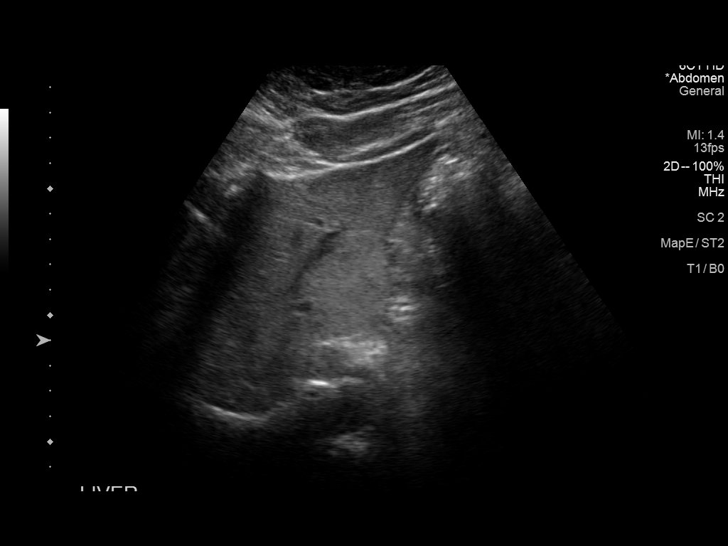

[14 of 25 positions shown; findings below may reference images not displayed]

FINDINGS: Gallbladder:

No gallstones or wall thickening visualized. No sonographic Murphy
sign noted by sonographer.

Common bile duct:

Diameter: 2.7 mm

Liver:

No focal lesion identified. Heterogenous, increased parenchymal
echogenicity. Portal vein is patent on color Doppler imaging with
normal direction of blood flow towards the liver.

Other: None.
IMPRESSION: Hepatic steatosis.  No focal hepatic lesion.

## 2023-01-01 NOTE — Progress Notes (Signed)
Formatting of this note is different from the original.  Images from the original note were not included.    [facility]  [facility]  [address]  [place] [address]  Dept: [phone]  Dept Fax: [phone]  Loc: [phone]     Visit type: Established patient    Reason for Visit: Follow-up and Diabetes Mellitus    Assessment and Plan       1. Type 2 diabetes mellitus with hyperglycemia, with long-term current use of insulin (HCC)  -     atorvastatin (Lipitor) 40 MG tablet; Take 1 tablet (40 mg) by mouth daily., Starting Thu 01/01/2023, Normal  -     Semaglutide, 2 MG/DOSE, (Ozempic, 2 MG/DOSE,) 8 MG/3ML solution pen-injector; Inject 2 mg under the skin 1 (one) time per week., Starting Thu 01/01/2023, Normal  -     Basaglar KwikPen 100 UNIT/ML pen; Inject 20 Units under the skin Nightly., Starting Thu 01/01/2023, Normal  -     Hemoglobin A1c  -     Comprehensive metabolic panel  -     Lipid panel  2. Type 2 diabetes mellitus with hyperlipidemia (HCC)  (HCC)  3. Abnormal thyroid function test  4. Multiple thyroid nodules  5. Encounter for therapeutic drug monitoring    Goal A1C = 6.5-7%.   Diabetes is not stable  Insulin is necessary for ongoing mgmt.    - A1C is 8.4%, Libre shows TIR: 34%, no lows   - Will no longer be able to afford [place], does not want to do finger sticks at all   - Pt has not been giving humalog or basaglar  for weeks    - Pt will make the following changes to regimen:   - Resume Basaglar at lower dose of 20 units in AM, instructed to give at set time each AM for adherence   - Hold Novolog for now as pt states he will not take it anyway  - Continue Ozempic to 2 mg weekly  - Recommend libre download in 2-4 weeks  - Pt instructed again to put meals and insulin doses into [place] for better tracking  - Pt to call office if he is unable to afford medications due to changes in job and decreased income   - Discussed the need for BGL or [employer] to titrate medications effectively  - May benefit from SGLT-2 in the future, cost is an issue  - Discussed A1C and BG goals   - Encouraged lifestyle modifications of diet and exercise   - Encouraged optimal foot care- follow with podiatry if needed   - Encouraged following with ophthalmology  - Patient counseled on the importance of taking medication as prescribed  - Patient counseled on the effects of uncontrolled DM on other organ systems    - Patient counseled on risk factors assoicated with diabetes   - Patient counseled on detection and treatment of hypoglycemia  - Patient instructed to call office if BG >250 or <70 consistently     - HLP- Chol panel worse LDL elevated at 155, Cholesterol high at 211. Increase atorvastatin back to 40 mg, recheck FLP prior to next visit     Abnormal thyroid function  - 09/2021 patient was found to have a normal TSH with low free T4.  Repeat labs showed a normal T4 dialysis equilibrium  - 12/2021- TSH normal with a normal free T4  - 12/2022- TSH normal with a normal  free T4  - Pt is clinically euthyroid we will continue to follow thyroid labs for further abnormalities  - No treatment is indicated at this point    Thyroid nodules  - 06/2021-had a large thyroid on physical exam  - 09/2021- Thyroid US revealed multiple nodules.  Right lobe nodule 3.6 cm  - 11/2021- Thyroid FNA biopsy, sent for [facility] and was benign   - 11/2022- Thyroid US revealed increase in right nodule, biopsy was benign   - Patient does not want to undergo another biopsy in the future.  He is inquiring about possible surgical intervention.  Discussed in depth that surgery would be for symptom management alone and he would need to be on thyroid hormone replacement after surgery.  Patient does not want to be on another medication at this point, will hold off on surgical consultation as of now  - Plan for Thyroid US in 1 year.  If thyroid nodules continue to grow, may reevaluate need for surgical consultation  -  Patient was counseled on thyroid nodules and process for following them with annual ultrasounds    - Pt counseled about these recommendations. Pt voiced understanding.     Diagnostic data I reviewed:  Outside Documentation: N/A  Laboratory: Yes  Radiographic: N/A   Pt was advised of the results.      []   Records from outside facility/PCP office to be requested.      [x]  Scripts sent to pharmacy of pt choice.      Follow up for 05/20/23 with Dr. Venetia Night .    Subjective       HPI   PCP = [name], DO  Referring provider: PCP  LOV: 08/21/2022  DM Onset: 2018- based on blood work   Type of DM: 2    Since last office visit denies new health problems, denies hospitalizations, and denies surgeries.      Pt feels their blood sugars are unchanged since LOV. Pt complaints include:   Pt is having some issues with job and admits to mild depression   Will not be able to afford libre anymore    BG are still variable   He does not want to poke his fingers for BG checks   Missing both insulins often, last time he took either insulin was 2 weeks ago     Current DM Medications:  Basaglar 66 units at bedtime (8-10 pm)   Novolog to 12 units with meals, plus SS: 2 units: 50 >150  Ozempic to 2 mg weekly    Missing medication doses: Yes    Reports HGM via Libre 3   Blood Sugar Log present: Yes   Log Reviewed w/ pt: Yes   Scanned into Media: Yes   Hyperglycemia present: Yes   Hypoglycemia present: No     Following Diet for DM: Yes   2-3 meals per day, eating less overall   Tries to watch overall   Rare soda, rare sugar     Following Exercise Regimen: No   ADLS    Previously Used DM Meds: Yes   [place]  Insulin since 2022  Metformin- could not tolerate     Complications:  Cardiovascular -- Yes- HLP. Denies CAD, CVA  Statin Use -- Yes- atorvastatin 20 mg daily   Last DEE/Retina Eval: 08/2021, Dr. Emogene Morgan  Retinopathy -- No  Nephropathy -- No. Denies CKD.   ACE/ARB Use -- No  Polyneuropathy -- No,  Denies ulcers  Obesity -- Yes Body mass  index is 29.41 kg/m.  Other --. Hep B +. Denies ETOH use. Denies h/o pancreatitis  Denies family h/o thyroid cancer  - Pt will be following with urology for infertility concerns     Abnormal TFTs  - 09/30/21- Patient was found to have a normal TSH with a low free T4  - 10/15/21-Repeat thyroid function test revealed a normal TSH with low free T4.  T4 with equilibrium was normal at 1.1  - FT4 equilibrium dialysis was WNL   - Denies h/o LT4 or antithyroid medications  - 01/10/2022-TSH normal, free T4 normal  - 12/2022- TSH normal, free T4 normal    Thyromegaly/Thyroid Nodule  - 06/2021 Found to have thyromegaly on PE during OV (new pt appt).   - 09/2021 Thyroid US: Diffusely heterogeneous and MN thyroid gland with enlargement of right lobe. 3.6 cm mid to lower right lobe nodule, hyperechoic, small cystic areas (TI-RADS 3); two left lobe solid hypoechoic nodules, measuring 0.7 cm and 0.9 cm. (TI-RADS 4) but less than 1.5 cm.   - 11/2021- Thyroid FNA biopsy, sent [facility] and was benign   - 11/2022- Thyroid US: Lower right mid lobe nodule, partially cystic, measuring 3.9 cm (TI-RADS 3); left lower lobe nodule, solid, measuring 1 cm, (TI-RADS 4);, Measuring left lower lobe nodule, solid 0.9 cm, (TI-RADS 4)   - 11/2022- FNA for right thyroid nodule was benign  - Pt does not want another FNA, he is inquiring about surgical intervention  - Denies family h/o thyroid cancer    Denies recent significant weight changes  Denies hyper-defecation or constipation  Denies heat intolerance or cold intolerance  Denies hair or skin changes  Denies palpitations or tremor  + Fatigue  + Depression   Denies difficulty sleeping  Denies depression, anxiety, or irritability  Denies dysphagia or odynophagia  Denies neck or voice changes     Denies h/o recent steroid use  Denies h/o recent testing with contrast  Denies recent viral illness  Denies use of B-complex, biotin, or hair, skin, nails supplements  Denies use of iron, calcium, or  soy    Review of Systems   An entire ROS was performed at the time of this encounter. Unless noted above in the HPI, the ROS is negative.     Allergies   Allergen Reactions    Metformin And Related Diarrhea     Outpatient Medications Prior to Visit   Medication Sig Dispense Refill    Continuous Blood Gluc Sensor (FreeStyle Libre 3 Sensor) misc 1 Device every 14 (fourteen) days. 2 each 11    entecavir (Baraclude) 1 MG tablet Take 1 mg by mouth daily.      insulin pen needle 32G x 4 mm misc Use as instructed 4 times daily 400 each 3    atorvastatin (Lipitor) 20 MG tablet Take 1 tablet (20 mg) by mouth daily. 90 tablet 1    Basaglar KwikPen 100 UNIT/ML pen Inject 60 Units under the skin Nightly. 60 Units at night 54 mL 3    insulin lispro (HumaLOG KWIKPEN) 100 UNIT/ML pen injection 8 units with meals, plus SS: 2 units for every 50 points >150. Max dose of 54 units per day 51 mL 3    Semaglutide, 2 MG/DOSE, (Ozempic, 2 MG/DOSE,) 8 MG/3ML solution pen-injector Inject 2 mg under the skin 1 (one) time per week. 9 mL 3    ibuprofen 800 MG tablet Take 1 tablet by mouth every 8 hours as needed.  No facility-administered medications prior to visit.       Past Medical History:   Diagnosis Date    Bell's palsy 2016    Diabetes mellitus (HCC) 2018    Hepatitis B 2017    Mixed hypercholesterolemia and hypertriglyceridemia     Multinodular goiter 09/2021    Rt bx benign nodule- needs annual rech U/S    Type 2 diabetes mellitus (HCC)        Social History     Tobacco Use    Smoking status: Never    Smokeless tobacco: Never   Substance Use Topics    Alcohol use: Never       Past Surgical History:   Procedure Laterality Date    COLONOSCOPY  2017    ESOPHAGOGASTRODUODENOSCOPY  2017    IR US THYROID BIOPSY (HISTORICAL) Right 11/2021    benign nodule     Family History   Problem Relation Name Age of Onset    No Known Problems Mother      Diabetes type II Father      No Known Problems Sister      Liver disease Brother      No Known  Problems Mother's Sister      No Known Problems Mother's Brother       Objective       BP 116/82   Pulse 77   Ht 5\' 10"  (1.778 m)   Wt 205 lb (93 kg)   BMI 29.41 kg/m   Physical Exam  Vitals reviewed.   Constitutional:       Appearance: Normal appearance.   HENT:      Head: Normocephalic and atraumatic.      Nose: Nose normal.   Neck:      Thyroid: Thyroid mass (right larger than left) present.   Pulmonary:      Effort: Pulmonary effort is normal.   Skin:     General: Skin is warm and dry.   Neurological:      General: No focal deficit present.      Mental Status: He is alert and oriented to person, place, and time.   Psychiatric:         Mood and Affect: Mood normal.         Behavior: Behavior normal.         Thought Content: Thought content normal.         Judgment: Judgment normal.     Data Reviewed and Summarized       Labs:   Lab Results   Component Value Date    HGBA1C 8.4 (H) 12/22/2022     No components found for: "EAG"    Chemistry    Lab Results   Component Value Date/Time    NA 139 12/22/2022 0900    K 4.2 12/22/2022 0900    CL 107 12/22/2022 0900    CO2 22 12/22/2022 0900    BUN 12 12/22/2022 0900    CREATININE 0.90 12/22/2022 0900    CREATININE 0.89 04/05/2021 0836    Lab Results   Component Value Date/Time    CALCIUM 9.2 12/22/2022 0900    ALKPHOS 87 12/22/2022 0900    AST 41 12/22/2022 0900    ALT 56 (H) 12/22/2022 0900    BILITOT 0.6 12/22/2022 0900       Lab Results   Component Value Date    CHOL 211 (H) 12/22/2022    CHOL 128 04/05/2021     Lab Results  Component Value Date    TRIG 108 12/22/2022    TRIG 90 04/05/2021     Lab Results   Component Value Date    HDL 34 (L) 12/22/2022    HDL 32 (L) 04/05/2021     Lab Results   Component Value Date    LDLCALC 155 (H) 12/22/2022     No results found for: "VLDL"  Lab Results   Component Value Date    CHOLHDLRATIO 6 12/22/2022    CHOLHDLRATIO 4 04/05/2021     Lab Results   Component Value Date    MICROALBUR 26.6 12/22/2022     Lab Results   Component  Value Date    TSH 2.009 12/22/2022      Latest Reference Range & Units 04/05/21 08:36 09/30/21 08:05 09/30/21 08:12 10/15/21 08:47 01/03/22 11:44   THYROID STIMULATING HORMONE 0.465 - 4.680 uIU/mL 1.971  1.952 1.843 1.490   FREE T4 0.78 - 2.19 ng/dL   1.61 (L) 0.96 (L) 0.45   T3, FREE 2.77 - 5.27 pg/mL  3.55        Component      Latest Ref Rng & Units 10/15/2021 01/03/2022   T4 BY EQUIL DIALYS,FREE      1.1 - 2.4 ng/dL 1.1 1.1     Imaging/Testing:  Narrative & Impression   Patient Name: Stephen Nolan, Stephen Nolan  DOB: 10-28-1980  MRN: [id number]  Acct#: [id number]  Accession: [id number]  Exam Date/Time: 10/07/2021 08:23  Procedure: US THYROID  Ordering Provider: Janann August  Reason For Exam: THYROID NODULE        ULTRASOUND THYROID:    Indication:  Goiter.    Examination: Ultrasonographic evaluation of the thyroid    Comparison:  None available    Findings:   RIGHT LOBE:   5.6 x 3.5 x 2.4cm                 LEFT LOBE:     4.6 x 1.4 x 1.0cm                 ECHOGENICITY PATTERN: Heterogeneous and multinodular    IMPRESSION:  THYROID ULTRASOUND :     1. Diffusely heterogeneous and multinodular thyroid gland with enlargement right lobe.    2. 3.6 cm mid to lower right lobe nodule, mainly solid and hyperechoic with small cystic areas. This is probably graded as a TI RADS three nodule in which category FNA to be considered for nodules greater than 2.5 cm.    3. Two left lobe solid hypoechoic TI RADS four nodules measuring 0.7 cm and 0.9 cm. In this category FNA only to be considered for nodules greater than 1.5 cm.    Report Dictated on Workstation: [id number]    Electronically Signed By: Loleta Rose    Electronically Signed Date/Time: 10/07/2021 9:36 AM EST         Patient Name: Stephen Nolan  DOB: May 13, 1981  MRN: [id number]  Acct#: [id number]  Accession: [id number]  Exam Date/Time: 11/21/2021 13:38  Procedure: US GUIDED THYROID NEEDLE CORE BIOPSY  Ordering Provider: Estelle June  Reason For Exam: THYROID  NODULE      EXAMINATION: Ultrasound-guided thyroid biopsy.    CLINICAL INFORMATION: Complex right thyroid nodule.    Prior to the procedure, the details of the examination were explained to the patient. He understands the risks, alternative, and benefits and wishes to proceed. Informed written consent is obtained and placed in the chart.    ANESTHESIA: Local anesthesia is maintained with  Lidocaine.    PROCEDURE: The patient was placed supine on the ultrasound cart. A limited ultrasound of the thyroid demonstrates a complex 3.5 cm right thyroid nodule. A site was picked for percutaneous biopsy. This area was prepped and draped in the usual fashion. Under direct ultrasound-guidance (with a sterile probe cover and sterile gel), several 25 gauge fine needle aspirations were obtained.    There were no immediate complications. The patient tolerated the procedure without incident.    IMPRESSION:  1. Biopsy of the right thyroid gland nodule as detailed above. There were no immediate complications.    Report Dictated on Workstation: [id number]    Electronically Signed By: Macarthur Critchley    Electronically Signed Date/Time: 11/21/2021 4:41 PM EDT       Component    Addendum   Please see Barton Fanny Req: [id number] for Genomic Sequencing Classifier, the results which have been scanned.   Addendum electronically signed by Mindi Junker, MD on 12/12/2021 at 1509   Final Diagnosis   A - Thyroid, Right - Fine Needle Aspirate:    Diagnostic Category: ATYPIA OF UNDETERMINED SIGNIFICANCE.      NOTE: Reflex Afirma testing will be sent on all cases diagnosed as Atypia of Undetermined Significance/Follicular Lesion of Undetermined Significance or Suspicious for a Follicular/Hurthle Cell Neoplasm with results to follow.    Electronically signed by Mindi Junker, MD on 11/22/2021 at 1232   Comment    According to the [employer] System for Reporting Thyroid Cytopathology, the implied risk of malignancy associated with the general categories are  as follows: "Benign" 0-3%, "Atypia of Undetermined Significance" 5-15%, "Follicular Lesion of Undetermined Significance" 5-15%, "Follicular Neoplasm or Suspicious for a Follicular Neoplasm" 15-30%, "Suspicious for Malignancy" 60-75% and "Malignancy" 97-99%.  Malignancy rates at individual institutions may differ from these published rates.    Gross Description    20 mL clear pink fluid received in CytoLyt.    Materials Prepared:  ThinPrep and 1 Cell Block(s)  1 Afirma tube   Disclaimer    Disclaimer: The following statement applies to all immunohistochemistry, in situ hybridization, molecular studies, and immunofluorescence testing.     The use of one or more reagents in the above tests is regulated as an analyte specific reagent (ASR). These tests were developed and their performance characteristics determined by the clinical laboratories of [facility]. They have not been cleared by the [place] Food and Drug Administration (FDA). The FDA has determined that such clearance or approval is not necessary.     All the above immunostains were performed on paraffin embedded tissue. Appropriate positive and negative controls (where applicable) were run in parallel with the patient's specimen; these controls showed expected staining pattern, with acceptable intensity of staining. Immunohistochemical assays have not been validated on decalcified tissues. Results should be interpreted with caution given the raised possibility of false negativity on decalcified specimens.   Case Screening Location    [facility], [address], [place] [address]; CLIA: [id number]; Joint Commission: [facility] 6964; CAP: [id number]   Resulting Agency [facility]     Electronically signed Pati Gallo, APRN - CNP 01/01/23        Electronically signed by Pati Gallo, APRN - CNP at 01/01/2023  8:32 AM EDT

## 2023-01-01 NOTE — Addendum Note (Signed)
Addended by: Karna Dupes on: 05/19/2023 09:06 AM     Modules accepted: Orders      Electronically signed by Karna Dupes at 05/19/2023  9:06 AM EDT

## 2023-05-20 NOTE — Addendum Note (Signed)
Addended by: Juliette Mangle on: 05/28/2023 09:02 AM     Modules accepted: Orders      Electronically signed by Juliette Mangle at 05/28/2023  9:02 AM EDT

## 2023-05-20 NOTE — Progress Notes (Signed)
Formatting of this note is different from the original.  Images from the original note were not included.      [facility] Janalyn [place]ouse [place]  [address]  [place] [address]  Dept: [phone]  Dept Fax: [phone]    Visit Date: 05/20/2023    HPI:     Stephen Nolan is a 42 y.o. male who presents today for:  Chief Complaint   Patient presents with    Follow-up    Diabetes Mellitus     HPI:   DM Onset: 2018- based on blood work   Type of DM: 2    Since last office visit denies new health problems, denies hospitalizations, and denies surgeries.      Will get a new job that he thinks it will be good for him     Current DM Medications:  Basaglar 24 units at bedtime     Ozempic 2 mg weekly    Missing medication doses: Yes    Reports HGM via Libre 3   Blood Sugar Log present: Yes   Log Reviewed w/ pt: Yes   Scanned into Media: Yes   Hyperglycemia present: Yes   Hypoglycemia present: No       Following Diet for DM: Yes   2-3 meals per day, eating less overall   Tries to watch overall   Rare soda, rare sugar     Following Exercise Regimen: No   ADLS    Previously Used DM Meds: Yes   Victoza  Insulin since 2022  Metformin- could not tolerate     Complications:  Cardiovascular -- Yes- HLP. Denies CAD, CVA  Statin Use -- Yes- atorvastatin 20 mg daily not 40 mg  Last DEE/Retina Eval: 08/2022, Dr. Emogene Morgan  Retinopathy -- yes  Nephropathy -- No. Denies CKD.   ACE/ARB Use -- No  Polyneuropathy -- No,  Denies ulcers  Obesity -- Yes Body mass index is 29.41 kg/m.  Other --. Hep B +. Denies ETOH use. Denies h/o pancreatitis  Denies family h/o thyroid cancer  - Pt will be following with urology for infertility concerns     Abnormal TFTs  - 09/30/21- Patient was found to have a normal TSH with a low free T4  - 10/15/21-Repeat thyroid function test revealed a normal TSH with low free T4.  T4 with equilibrium was normal at 1.1  - FT4 equilibrium dialysis was WNL   - Denies h/o LT4 or antithyroid medications  -  01/10/2022-TSH normal, free T4 normal  - 12/2022- TSH normal, free T4 normal    Thyromegaly/Thyroid Nodule  - 06/2021 Found to have thyromegaly on PE during OV (new pt appt).   - 09/2021 Thyroid US: Diffusely heterogeneous and MN thyroid gland with enlargement of right lobe. 3.6 cm mid to lower right lobe nodule, hyperechoic, small cystic areas (TI-RADS 3); two left lobe solid hypoechoic nodules, measuring 0.7 cm and 0.9 cm. (TI-RADS 4) but less than 1.5 cm.   - 11/2021- Thyroid FNA biopsy, sent for [facility] and was benign   - 11/2022- Thyroid US: Lower right mid lobe nodule, partially cystic, measuring 3.9 cm (TI-RADS 3); left lower lobe nodule, solid, measuring 1 cm, (TI-RADS 4);, Measuring left lower lobe nodule, solid 0.9 cm, (TI-RADS 4)   - 11/2022- FNA for right thyroid nodule was benign    - Denies family h/o thyroid cancer    Denies depression, anxiety, or irritability  Denies dysphagia or odynophagia  Denies neck or voice changes  Denies h/o recent steroid use  Denies h/o recent testing with contrast  Denies recent viral illness  Denies use of B-complex, biotin, or hair, skin, nails supplements  Denies use of iron, calcium, or soy      The patient current PCP is Algis Liming, DO  Past Medical History:   Diagnosis Date    Bell's palsy 2016    Diabetes mellitus (HCC) 2018    Hepatitis B 2017    Mixed hypercholesterolemia and hypertriglyceridemia     Multinodular goiter 09/2021    Rt bx benign nodule- needs annual rech U/S    Type 2 diabetes mellitus (HCC)      Past Surgical History:   Procedure Laterality Date    COLONOSCOPY  2017    ESOPHAGOGASTRODUODENOSCOPY  2017    IR US THYROID BIOPSY (HISTORICAL) Right 11/2021    benign nodule     Current Outpatient Medications   Medication Sig Dispense Refill    entecavir (Baraclude) 1 MG tablet Take 1 mg by mouth daily.      atorvastatin (Lipitor) 40 MG tablet Take 1 tablet (40 mg) by mouth daily. 90 tablet 3    Basaglar KwikPen 100 UNIT/ML pen Inject 30 Units  under the skin Nightly. 18 mL 3    Continuous Glucose Sensor (FreeStyle Libre 3 Sensor) misc 1 Device every 14 (fourteen) days. 2 each 11    insulin pen needle 32G x 4 mm misc Use as instructed 4 times daily 400 each 3    metFORMIN XR (Glucophage-XR) 500 MG 24 hr tablet Take 1 tablet (500 mg) by mouth with evening meal. Do not crush, chew, or split. 90 tablet 3    Semaglutide, 2 MG/DOSE, (Ozempic, 2 MG/DOSE,) 8 MG/3ML solution pen-injector Inject 2 mg under the skin 1 (one) time per week. 9 mL 3     No current facility-administered medications for this visit.     Allergies   Allergen Reactions    Metformin And Related Diarrhea     Family History   Problem Relation Name Age of Onset    No Known Problems Mother      Diabetes type II Father      No Known Problems Sister      Liver disease Brother      No Known Problems Mother's Sister      No Known Problems Mother's Brother       Social History     Socioeconomic History    Marital status: Married   Tobacco Use    Smoking status: Never    Smokeless tobacco: Never   Substance and Sexual Activity    Alcohol use: Never    Drug use: Never     Social Determinants of Health     Financial Resource Strain: Low Risk  (01/04/2021)    Received from [facility]    Overall Financial Resource Strain (CARDIA)     Difficulty of Paying Living Expenses: Not very hard   Food Insecurity: No Food Insecurity (01/04/2021)    Received from [facility]    Hunger Vital Sign     Worried About Running Out of Food in the Last Year: Never true     Ran Out of Food in the Last Year: Never true       Subjective:     Review of Systems   All other systems reviewed and are negative.  Objective:     BP 128/83   Pulse 66   Ht 5\' 10"  (1.778 m)   Wt 202 lb (91.6 kg)   BMI 28.98 kg/m     Physical Exam  Vitals and nursing note reviewed.   Constitutional:       Appearance: Normal appearance.    HENT:      Head: Normocephalic and atraumatic.      Nose: Nose normal.   Eyes:      General: No scleral icterus.     Extraocular Movements: Extraocular movements intact.   Neck:      Thyroid: No thyromegaly (2 cm right thyroid nodule).   Cardiovascular:      Rate and Rhythm: Normal rate and regular rhythm.      Pulses: Normal pulses.      Heart sounds: Normal heart sounds.   Pulmonary:      Effort: Pulmonary effort is normal. No respiratory distress.      Breath sounds: Normal breath sounds. No stridor. No wheezing.   Abdominal:      General: Abdomen is flat. There is no distension.      Palpations: Abdomen is soft.   Musculoskeletal:      Cervical back: Neck supple.      Right lower leg: No edema.      Left lower leg: No edema.      Comments: Libre 3 sensor on left arm    Skin:     General: Skin is warm.      Findings: No rash.   Neurological:      Mental Status: He is alert and oriented to person, place, and time.      Deep Tendon Reflexes: Reflexes normal.   Psychiatric:         Mood and Affect: Mood normal.         Behavior: Behavior normal.         Thought Content: Thought content normal.         Judgment: Judgment normal.         Diagnostic Workup:      Latest Reference Range & Units 12/22/22 09:00 05/19/23 09:22   SODIUM 135 - 145 mmol/L 139 138   POTASSIUM 3.5 - 5.1 mmol/L 4.2 4.0   CHLORIDE 98 - 107 mmol/L 107 106   Carbon Dioxide (CO2) 22 - 30 mmol/L 22 24   ANION GAP 3 - 13 mmol/L 10 8   Urea Nitrogen (BUN) 9 - 20 mg/dL 12 12   Creatinine 0.45 - 1.25 mg/dL 4.09 8.11   eGFR >91.4 mL/min/1.11m*2 >90.0 >90.0   GLUCOSE 70 - 100 mg/dL 782 (H) 956 (H)   CALCIUM 8.4 - 10.4 mg/dL 9.2 9.6   ALKALINE PHOSPHATASE - [facility] 38 - 126 U/L 87 83   ALBUMIN - [facility] 3.5 - 5.0 g/dL 4.5 4.2   TOTAL PROTEIN 6.3 - 8.2 g/dL 8.0 7.6   AST - [facility] 15 - 46 U/L 41 30   ALT - [facility] 0 - 49 U/L 56 (H) 34   BILIRUBIN, TOTAL - [facility] 0.2 - 1.3 mg/dL 0.6 0.6   TRIGLYCERIDES - [facility] <150 mg/dL 213 086   HDL CHOLESTEROL - [facility] 40 - 60  mg/dL 34 (L) 35 (L)   LOW DENSITY LIPOPROTEIN 0 - <100 mg/dL 578 (H) 469 (H)   CHOL/HDL  6 6   HEMOGLOBIN A1C - [facility] <5.7 %  10.3 (H)   THYROID STIMULATING HORMONE 0.465 - 4.680 uIU/mL 2.009  T4, FREE 0.78 - 2.19 ng/dL 1.61    CHOLESTEROL <096 mg/dL 045 (H) 409 (H)   ESTIMATED AVERAGE GLUCOSE mg/dL  811   (H): Data is abnormally high  (L): Data is abnormally low    Narrative & Impression   Patient Name: Stephen Nolan, Stephen Nolan  DOB: 08/31/1980  MRN: [id number]  Acct#: [id number]  Accession: [id number]  Exam Date/Time: 11/18/2022 08:49  Procedure: US THYROID  Ordering Provider: Pati Gallo  Reason For Exam: THYROID NODULE        ULTRASOUND THYROID:    Indication:  Follow-up thyroid nodules.    Examination: Ultrasonographic evaluation of the thyroid    Comparison:  10/07/2021    Findings:   RIGHT LOBE:   6.6 x 3.6 x 2.9 cm                 LEFT LOBE:     4.4 x 1.2 x 2.4 cm                 ECHOGENICITY PATTERN: Heterogeneous multinodular    IMPRESSION:  THYROID ULTRASOUND :     1. Diffusely heterogeneous and multinodular thyroid gland with large right lobe.    2. There is a 3.9 cm mid to lower right lobe partly cystic TI RADS 3 nodule.   This measured 3.6 cm 13 months ago and was biopsied one year ago.    3. Two lower left lobe solid probable TI RADS 4 nodules measuring 1.0 cm and 0.9 cm (these measured 0.9 cm and 0.7 cm on last exam 13 months ago).  In this category, FNA only to be considered for nodules greater than 1.5 cm.     Assessment:      Diagnosis Plan   1. Type 2 diabetes mellitus with hyperglycemia, with long-term current use of insulin (HCC)  atorvastatin (Lipitor) 40 MG tablet    Zinc Transporter 8 Antibody    Glutamic acid decarboxylase (Sendout)    Islet Antigen 2 Ab ([facility])    Anti-islet cell antibody (Sendout)    C-Peptide    Comprehensive metabolic panel    Comprehensive metabolic panel    Lipid panel    TSH    T4, free    Hemoglobin A1c    Microalbumin / creatinine urine ratio    Basaglar  KwikPen 100 UNIT/ML pen    Continuous Glucose Sensor (FreeStyle Libre 3 Sensor) misc    insulin pen needle 32G x 4 mm misc    Semaglutide, 2 MG/DOSE, (Ozempic, 2 MG/DOSE,) 8 MG/3ML solution pen-injector    Zinc Transporter 8 Antibody    Glutamic acid decarboxylase (Sendout)    Islet Antigen 2 Ab ([facility])    Anti-islet cell antibody (Sendout)    C-Peptide    Comprehensive metabolic panel    Comprehensive metabolic panel    Lipid panel    TSH    T4, free    Hemoglobin A1c    Microalbumin / creatinine urine ratio     2. Type 2 diabetes mellitus with hyperlipidemia (HCC)  (HCC)       3. Multiple thyroid nodules       4. Overweight (BMI 25.0-29.9)         Plan:     1. Type 2 diabetes mellitus with hyperglycemia, with long-term current use of insulin (HCC)    2. Type 2 diabetes mellitus with hyperlipidemia (HCC)  (HCC)    3. Multiple thyroid nodules    4. Overweight (BMI 25.0-29.9)  HbA1c 10.3% HbA1c target < 7%  Libre reviewed  TIR 34%  2% hypoglycemia  Has persistent hyperglycemia but some variable BG readings and occasional random hypoglycemia  Will screen for type 1 DM by obtaining Abs and C-peptide with FBG  Increase basaglar to 30 units QHS  Continue Ozempic 2 mg weekly   Will start metformin 500 XR 1 tab with meal    Libre 3 DL in [id number]     I have counseled the patient about the importance of following a healthy diet and exercise.    I have counseled the patient about hypoglycemia symptoms and treatment.  I have instructed the patient to follow with ophthalmology.    Last lipid panel revealed elevated LDL of 148. Increase atorvastatin to 40 mg daily, reckeck FLP in  3 months       For thyroid nodules  Last TFT normal   - 06/2021-had a large thyroid on physical exam  - 09/2021- Thyroid US revealed multiple nodules.  Right lobe nodule 3.6 cm  - 11/2021- Thyroid FNA biopsy, sent for [facility] and was benign   - 11/2022- Thyroid US revealed increase in right nodule, biopsy was benign     - Plan for Thyroid US in  11/2023.  If thyroid nodules continue to grow, may reevaluate need for surgical consultation  - Patient was counseled on thyroid nodules and process for following them with annual ultrasounds      I have addressed the above chronic illnesses including management, progression, and benefits and side effects of treatment.  I have reviewed prior records, interpreted test results and discussed management with the patient.     I spent 40 minutes with the pt which involved in coordination of care, medical evaluation, review of records, and/or counseling of the pt regarding her condition/disease state/prognosis on the date of this note.     Follow up in about 3 months (around 08/20/2023).    Milagros Loll, MD  8:21 AM  05/20/23  Pt was eval'd by me on today's date and the note has been electronically signed by me.     Electronically signed by Janann August, MD at 05/20/2023  8:22 AM EDT

## 2023-05-20 NOTE — Addendum Note (Signed)
Addended by: Juliette Mangle on: 05/28/2023 08:52 AM     Modules accepted: Orders      Electronically signed by Juliette Mangle at 05/28/2023  8:52 AM EDT

## 2023-05-20 NOTE — Addendum Note (Signed)
Addended by: Juliette Mangle on: 05/28/2023 08:53 AM     Modules accepted: Orders      Electronically signed by Juliette Mangle at 05/28/2023  8:53 AM EDT

## 2023-09-14 ENCOUNTER — Emergency Department: Admit: 2023-09-14 | Payer: PRIVATE HEALTH INSURANCE | Primary: Family Medicine

## 2023-09-14 ENCOUNTER — Inpatient Hospital Stay
Admit: 2023-09-14 | Discharge: 2023-09-15 | Disposition: A | Discharge status: Home / Self Care | Payer: PRIVATE HEALTH INSURANCE | Attending: Student in an Organized Health Care Education/Training Program

## 2023-09-14 DIAGNOSIS — R079 Chest pain, unspecified: Secondary | ICD-10-CM

## 2023-09-14 DIAGNOSIS — R0789 Other chest pain: Secondary | ICD-10-CM

## 2023-09-14 LAB — CBC WITH AUTO DIFFERENTIAL
Basophils %: 0.3 %
Basophils Absolute: 0 10*3/uL (ref 0.0–0.2)
Eosinophils %: 0.7 %
Eosinophils Absolute: 0 10*3/uL (ref 0.0–0.6)
Hematocrit: 43 % (ref 40.5–52.5)
Hemoglobin: 14.9 g/dL (ref 13.5–17.5)
Lymphocytes %: 56.1 %
Lymphocytes Absolute: 3.2 10*3/uL (ref 1.0–5.1)
MCH: 28.4 pg (ref 26.0–34.0)
MCHC: 34.6 g/dL (ref 31.0–36.0)
MCV: 82.1 fL (ref 80.0–100.0)
MPV: 10.5 fL (ref 5.0–10.5)
Monocytes %: 11.2 %
Monocytes Absolute: 0.6 10*3/uL (ref 0.0–1.3)
Neutrophils %: 31.7 %
Neutrophils Absolute: 1.8 10*3/uL (ref 1.7–7.7)
Platelets: 133 10*3/uL — ABNORMAL LOW (ref 135–450)
RBC: 5.24 M/uL (ref 4.20–5.90)
RDW: 14.1 % (ref 12.4–15.4)
WBC: 5.8 10*3/uL (ref 4.0–11.0)

## 2023-09-14 LAB — BASIC METABOLIC PANEL W/ REFLEX TO MG FOR LOW K
Anion Gap: 11 (ref 3–16)
BUN: 12 mg/dL (ref 7–20)
CO2: 26 mmol/L (ref 21–32)
Calcium: 8.9 mg/dL (ref 8.3–10.6)
Chloride: 99 mmol/L (ref 99–110)
Creatinine: 1 mg/dL (ref 0.9–1.3)
Est, Glom Filt Rate: >90 (ref >60)
Glucose: 235 mg/dL — ABNORMAL HIGH (ref 70–99)
Potassium reflex Magnesium: 3.8 mmol/L (ref 3.5–5.1)
Sodium: 136 mmol/L (ref 136–145)

## 2023-09-14 LAB — RAPID INFLUENZA A/B ANTIGENS
Rapid Influenza A Ag: NEGATIVE
Rapid Influenza B Ag: NEGATIVE

## 2023-09-14 LAB — COVID-19, RAPID: SARS-CoV-2, NAAT: NOT DETECTED

## 2023-09-14 LAB — TROPONIN
Troponin, High Sensitivity: 12 ng/L (ref 0–22)
Troponin, High Sensitivity: 17 ng/L (ref 0–22)

## 2023-09-14 LAB — BRAIN NATRIURETIC PEPTIDE: NT Pro-BNP: <36 pg/mL (ref 0–124)

## 2023-09-14 MED ORDER — AMOXICILLIN-POT CLAVULANATE 875-125 MG PO TABS
875-125 | ORAL_TABLET | Freq: Two times a day (BID) | ORAL | 0 refills | Status: DC
Start: 2023-09-14 — End: 2023-09-14

## 2023-09-14 MED ORDER — AMOXICILLIN-POT CLAVULANATE 875-125 MG PO TABS
875-125 | ORAL_TABLET | Freq: Two times a day (BID) | ORAL | 0 refills | Status: AC
Start: 2023-09-14 — End: 2023-09-21

## 2023-09-14 MED ORDER — AMOXICILLIN-POT CLAVULANATE 875-125 MG PO TABS
875-125 | Freq: Once | ORAL | Status: AC
Start: 2023-09-14 — End: 2023-09-14
  Administered 2023-09-14: 23:00:00 1 via ORAL

## 2023-09-14 MED FILL — AMOXICILLIN-POT CLAVULANATE 875-125 MG PO TABS: 875-125 MG | ORAL | Qty: 1

## 2023-09-14 NOTE — Discharge Instructions (Addendum)
You were evaluated in the emergency department for chest pain and dental pain. Assessments and testing completed during your visit were reassuring and at this time there is no indication for further testing, treatment or admission to the hospital. Given this it is appropriate to discharge you from the emergency department. At the time of discharge we discussed the following:    Please review this visit to the emergency department with your primary doctor.  Regarding your chest pain seek further guidance for consideration of a stress test or other testing of your heart.  For dental condition I expect it to improve with antibiotics though please seek further guidance from your dentist.  Please take the antibiotics to completion    Please note that sometimes it is difficult to diagnose a medical condition early in the disease process before the disease is fully manifest. Because of this, should you develop any new or worsening symptoms, you may return at any time to the emergency department for another evaluation. If available you are also recommended to review this visit with your primary care physician or other medical provider in the next 7 days. Thank you for allowing [place] to care for you today.

## 2023-09-14 NOTE — ED Provider Notes (Signed)
 [place] [facility]     EMERGENCY DEPARTMENT ENCOUNTER         Pt Name: Stephen Nolan   MRN: [id number]   Birthdate [birth year]   Date of evaluation: 09/14/2023   Provider: Felecia Jan, MD   PCP: [name], DO   Note Started: 5:13 PM EST 09/14/23       Chief Complaint     Chest Pain (Pt reports Friday night, started with cough, fever, nasal congestion. +non prod cough. Sts Saturday, started with chest pain, mostly with cough and deep breath. No sob at present)      History of Present Illness     Stephen Nolan is a 43 y.o. male who presents with concern for chest pain as well as dental pain.  The patient is an insulin-dependent diabetic also on therapy for chronic hepatitis B.  He has generally been in baseline health until the past few days.  Regarding his chest pain he states that he began on Friday night he had some associated cough and congestion.  He also had fevers with rigors.  He persisted with chest pain through Saturday and into Sunday though it was resolving.  He noted the pain was worse with coughing and localized to the anterior right chest no other clearly provoking or palliating features.  He took no medications to treat no history of similar.  He has no known CAD or previous cardiac testing.  He had no associated symptoms no shortness of breath nausea vomiting diaphoresis syncope or near syncope.  Today his pain is effectively resolved but he remains concerned for the condition of his heart and lungs and therefore presents for evaluation.  Somewhat incidentally he also has some painful swelling about the gums of the right maxillary teeth history of similar and has previously required antibiotics for this.  Despite his illness he is maintaining adequate oral intake including his prescription medications      Review of Systems     Positives and pertinent negatives as per HPI.    Past Medical, Surgical, Family, and Social History     He has a past medical history  of Bell's palsy, Diabetes mellitus (HCC), Hepatitis B, and Mixed hypercholesterolemia and hypertriglyceridemia.  He has a past surgical history that includes Esophagogastroduodenoscopy (2017) and Colonoscopy (2017).  His family history includes Diabetes type 2  in his father; Liver Disease in his brother; No Known Problems in his brother, mother, sister, sister, and sister.  He reports that he has never smoked. He has never used smokeless tobacco. He reports that he does not currently use alcohol. He reports that he does not use drugs.    SCREENINGS:          Glasgow Coma Scale  Eye Opening: Spontaneous  Best Verbal Response: Oriented  Best Motor Response: Obeys commands  Glasgow Coma Scale Score: 15            Heart Score for chest pain patients  History: Slightly Suspicious  ECG: Non-Specifc repolarization disturbance/LBTB/PM  Patient Age: < 45 years  *Risk factors for Atherosclerotic disease: Diabetes Mellitus, Hypercholesterolemia, Hypertension  Risk Factors: > 3 Risk factors or history of atherosclerotic disease*  Troponin: < 1X normal limit  Heart Score Total: 3           CIWA Assessment  BP: (!) 125/94  Pulse: 71               Medications     Current Discharge Medication List  CONTINUE these medications which have NOT CHANGED    Details   entecavir (BARACLUDE) 1 MG tablet TAKE 1 TABLET BY MOUTH ONCE DAILY ON AN EMPTY STOMACH      OZEMPIC, 2 MG/DOSE, 8 MG/3ML SOPN sc injection INJECT 2MG  SUBCUTANEOUSLY ONCE A WEEK      insulin glargine (LANTUS SOLOSTAR) 100 UNIT/ML injection pen Inject 62 Units into the skin nightly  Qty: 7 Adjustable Dose Pre-filled Pen Syringe, Refills: 5      Continuous Blood Gluc Sensor (FREESTYLE LIBRE 2 SENSOR) MISC Change sensors every 14 days  Qty: 2 each, Refills: 5    Associated Diagnoses: Type 2 diabetes mellitus with hyperglycemia, with long-term current use of insulin (HCC)      insulin aspart (NOVOLOG FLEXPEN) 100 UNIT/ML injection pen Inject 3 times daily before meals  using SS:  If glucose 70/140 no insulin, if 141-200, 4U, if 201-250, 8U, if >250, 12U  Qty: 5 Adjustable Dose Pre-filled Pen Syringe, Refills: 3    Associated Diagnoses: Type 2 diabetes mellitus with hyperglycemia, with long-term current use of insulin (HCC)      Exenatide (BYDUREON) 2 MG PEN 2 mg SubQ q week  Qty: 4 pen, Refills: 11      atorvastatin (LIPITOR) 20 MG tablet Take 1 tablet by mouth daily  Qty: 30 tablet, Refills: 11      ketoconazole (NIZORAL) 2 % cream Apply topically daily.  Qty: 60 g, Refills: 1      ibuprofen (ADVIL;MOTRIN) 800 MG tablet Take 1 tablet by mouth every 8 hours as needed for Pain  Qty: 20 tablet, Refills: 0      lidocaine viscous hcl (XYLOCAINE) 2 % SOLN solution Take 5 mLs by mouth every 3 hours as needed for Dental Pain  Qty: 100 mL, Refills: 1      Tenofovir Alafenamide Fumarate (VEMLIDY) 25 MG TABS 1 tab(s)             Allergies     He has No Known Allergies.    Physical Exam     INITIAL VITALS: BP: (!) 144/96, Temp: 98.5 F (36.9 C), Pulse: 68, Respirations: 18, SpO2: 98 %     General: alert and conversant in no distress  Skin: warm, dry and pink  Head: normocephalic atraumatic  Eyes: pupils equal, extra ocular movements intact  Mouth / Pharynx: moist mucus membranes; tender swelling about the right maxillary gumline/gingiva without evidence of abscess clinically would suggest ANUG  Neck: normal range of motion without pain  Chest: clear to auscultation  Heart: regular heart tones; no murmur  noted  Abdomen: soft and non tender   Extremities: warm and well perfused, no significant edema, equal radial pulses no calf dissymmetry    Additional Exams:  na    DiagnosticResults     ECG    I have reviewed the ECG as follows:    Sinus rhythm at a rate of 68 without ectopy.  Normal axis and intervals.  No evidence of acute ischemia.  Nonspecific ST changes particularly 1 and perhaps aVL may suggest early repolarization.  No prior for comparison.      RADIOLOGY:    My independent review  of medical imaging is as follows:    See ED course    Final interpretation of all medical imaging per radiology as below:    XR CHEST (2 VW)   Final Result      No acute findings are seen.      Electronically signed by Onalee Hua  Ison          LABS:   Results for orders placed or performed during the hospital encounter of 09/14/23   COVID-19, Rapid    Specimen: Nasopharyngeal Swab   Result Value Ref Range    SARS-CoV-2, NAAT Not Detected Not Detected   Rapid influenza A/B antigens    Specimen: Nasopharyngeal   Result Value Ref Range    Rapid Influenza A Ag Negative Negative    Rapid Influenza B Ag Negative Negative   CBC with Auto Differential   Result Value Ref Range    WBC 5.8 4.0 - 11.0 K/uL    RBC 5.24 4.20 - 5.90 M/uL    Hemoglobin 14.9 13.5 - 17.5 g/dL    Hematocrit 56.2 13.0 - 52.5 %    MCV 82.1 80.0 - 100.0 fL    MCH 28.4 26.0 - 34.0 pg    MCHC 34.6 31.0 - 36.0 g/dL    RDW 86.5 78.4 - 69.6 %    Platelets 133 (L) 135 - 450 K/uL    MPV 10.5 5.0 - 10.5 fL    Neutrophils % 31.7 %    Lymphocytes % 56.1 %    Monocytes % 11.2 %    Eosinophils % 0.7 %    Basophils % 0.3 %    Neutrophils Absolute 1.8 1.7 - 7.7 K/uL    Lymphocytes Absolute 3.2 1.0 - 5.1 K/uL    Monocytes Absolute 0.6 0.0 - 1.3 K/uL    Eosinophils Absolute 0.0 0.0 - 0.6 K/uL    Basophils Absolute 0.0 0.0 - 0.2 K/uL   BMP w/ Reflex to MG   Result Value Ref Range    Sodium 136 136 - 145 mmol/L    Potassium reflex Magnesium 3.8 3.5 - 5.1 mmol/L    Chloride 99 99 - 110 mmol/L    CO2 26 21 - 32 mmol/L    Anion Gap 11 3 - 16    Glucose 235 (H) 70 - 99 mg/dL    BUN 12 7 - 20 mg/dL    Creatinine 1.0 0.9 - 1.3 mg/dL    Est, Glom Filt Rate >90 >60    Calcium 8.9 8.3 - 10.6 mg/dL   Troponin   Result Value Ref Range    Troponin, High Sensitivity 17 0 - 22 ng/L   BNP   Result Value Ref Range    NT Pro-BNP <36 0 - 124 pg/mL   EKG 12 Lead   Result Value Ref Range    Ventricular Rate 68 BPM    Atrial Rate 68 BPM    P-R Interval 184 ms    QRS Duration 96 ms    Q-T Interval  350 ms    QTc Calculation (Bazett) 372 ms    P Axis 48 degrees    R Axis 3 degrees    T Axis 6 degrees    Diagnosis       Normal sinus rhythmMinimal voltage criteria for LVH, may be normal variant ( R in aVL )Nonspecific T wave abnormalityAbnormal ECG       ED BEDSIDE ULTRASOUND:  na    RECENT VITALS:  BP: (!) 125/94, Temp: 98.5 F (36.9 C), Pulse: 71,Respirations: 17, SpO2: 95 %     Procedures     na    ED Course and MDM     Stephen Nolan is a 43 y.o. male who presents with several days of chest pain associated with syndrome of cough and congestion and fevers.  Pain in fact is much improved today though given his associated dental condition as well he presents for evaluation.  Here he is afebrile hemodynamically stable in no acute distress he is clinically euvolemic nontoxic.  Given his stated history I favor he is in fact suffering a viral URI and workup directed in that regard.  Given his comorbidities including insulin-dependent diabetes I will screen for markers to suggest ACS though I have low clinical suspicion.  Given the stated history I have no concern for pulmonary embolism or aortic pathology and his exam is reassuring in this regard.  Regarding PE he would be low risk by Wells criteria and can be ruled out by [facility].  Regarding his dental condition I believe this represents ANUG and the patient is started on Augmentin.    ED Course as of 09/14/23 1842   Mon Sep 14, 2023   1755 WBC: 5.8 [NG]   1755 Hemoglobin Quant: 14.9 [NG]   1755 Hematocrit: 43.0 [NG]   1755 Platelet Count(!): 133 [NG]   1755 Blood counts reassuring chronic thrombocytopenia [NG]   1755 Influenza A Antigen: Negative [NG]   1755 Rapid Influenza B Ag: Negative  Influenza screen negative [NG]   1755 XR CHEST (2 VW)  Chest x-ray resulted without acute process. [NG]   1811 SARS-CoV-2, NAAT: Not Detected  COVID-negative [NG]   1811 Troponin, High Sensitivity: 17  Initial troponin benign EKG nonischemic I have low suspicion for ACS will  trend [NG]   1811 NT Pro-BNP: <36  BNP benign patient clinically euvolemic [NG]   1811 Sodium: 136 [NG]   1811 Potassium: 3.8 [NG]   1811 Chloride: 99 [NG]   1811 CARBON DIOXIDE: 26 [NG]   1811 Anion Gap: 11 [NG]   1811 Glucose(!): 235 [NG]   1811 BUN,BUNPL: 12 [NG]   1811 Creatinine: 1.0 [NG]   1811 Calcium: 8.9  Renal panel with mild hyperglycemia no anion gap no concern for DKA.  Otherwise reassuring [NG]   1812 At this time I calculate a heart score of 3 low risk overall I favor a musculoskeletal cause of his chest pain perhaps in the context of a viral URI [NG]   1812 Should his repeat troponin be reassuring he may provide for discharge in stable condition outpatient follow-up for further recommendations regarding prep stress testing via his primary and will also discharge on antibiotics for ANUG [NG]   1840 The patient is reassessed he is resting comfortably currently asymptomatic course has been reassuring I will see his repeat troponin but otherwise expect he will be per for discharge for outpatient follow-up with his primary for further recommendations regarding cardiac testing.  He is agreeable to this plan.  I have prescribed a course of antibiotics for his dental condition.  He has requested a paper prescription which is given.  The patient is discharged in stable condition [NG]      ED Course User Index  [NG] Drinda Butts, MD       Additional Reassessment    Is this patient to be included in the SEP-1 core measure? No Exclusion criteria - the patient is NOT to be included for SEP-1 Core Measure due to: 2+ SIRS criteria are not met    Medical Decision Making  Presentation for chest pain over several days now asymptomatic reassuring course heart score 3 appropriate for outpatient follow-up.  Also incidentally with dental pain consistent with ANUG will discharge on antibiotics    Problems Addressed:  ANUG (acute necrotizing  ulcerative gingivitis): acute illness or injury  Chest pain, unspecified type:  acute illness or injury    Amount and/or Complexity of Data Reviewed  Labs: ordered. Decision-making details documented in ED Course.  Radiology: ordered. Decision-making details documented in ED Course.  ECG/medicine tests: ordered. Decision-making details documented in ED Course.    Risk  Prescription drug management.  Decision regarding hospitalization.          The patient was given the followingmedications:  Orders Placed This Encounter   Medications    amoxicillin-clavulanate (AUGMENTIN) 875-125 MG per tablet 1 tablet     Order Specific Question:   Antimicrobial Indications     Answer:   Other     Order Specific Question:   Other Abx Indication     Answer:   ANUG    DISCONTD: amoxicillin-clavulanate (AUGMENTIN) 875-125 MG per tablet     Sig: Take 1 tablet by mouth 2 times daily for 7 days     Dispense:  14 tablet     Refill:  0    amoxicillin-clavulanate (AUGMENTIN) 875-125 MG per tablet     Sig: Take 1 tablet by mouth 2 times daily for 7 days     Dispense:  14 tablet     Refill:  0       CONSULTS:  None      CRITICAL CARE TIME   Total Critical Care time was 0 minutes, excluding separately reportable procedures in the context of the following condition na.  There was a high probability of clinically significant/life threatening deterioration in the patient's condition which required my urgent intervention.    Clinical Impression     1. Chest pain, unspecified type    2. ANUG (acute necrotizing ulcerative gingivitis)        Disposition     PATIENT REFERRED TO:  [name], DO  [address]  [address]  [place] [address]  [phone]    Schedule an appointment as soon as possible for a visit in 3 days        DISCHARGE MEDICATIONS:  Current Discharge Medication List        START taking these medications    Details   amoxicillin-clavulanate (AUGMENTIN) 875-125 MG per tablet Take 1 tablet by mouth 2 times daily for 7 days  Qty: 14 tablet, Refills: 0             DISPOSITION Decision To Discharge  09/14/2023 06:13:17 PM   DISPOSITION CONDITION Stable           Felecia Jan, MD (electronically signed)  Attending Emergency Physician    Please note this documentation has been produced using speech recognition software and may contain errors related to that system including errors in grammar, punctuation, and spelling, as well as words and phrases that may be inappropriate.  Efforts were made to edit the dictations.         Drinda Butts, MD  09/14/23 [phone]

## 2023-09-15 LAB — EKG 12-LEAD
Atrial Rate: 68 {beats}/min
P Axis: 48 degrees
P-R Interval: 184 ms
Q-T Interval: 350 ms
QRS Duration: 96 ms
QTc Calculation (Bazett): 372 ms
R Axis: 3 degrees
T Axis: 6 degrees
Ventricular Rate: 68 {beats}/min
